# Patient Record
Sex: Female | Born: 1990 | Hispanic: No | Marital: Married | Smoking: Never smoker
Health system: Southern US, Community
[De-identification: ages and names within clinical notes are randomized; demographics above are authoritative.]

## PROBLEM LIST (undated history)

## (undated) ENCOUNTER — Inpatient Hospital Stay (HOSPITAL_COMMUNITY): Payer: Self-pay

## (undated) DIAGNOSIS — D649 Anemia, unspecified: Secondary | ICD-10-CM

## (undated) HISTORY — PX: NO PAST SURGERIES: SHX2092

---

## 2015-01-03 ENCOUNTER — Inpatient Hospital Stay (HOSPITAL_COMMUNITY)
Admission: AD | Admit: 2015-01-03 | Discharge: 2015-01-03 | Disposition: A | Discharge status: Home / Self Care | Payer: Medicaid Other | Source: Ambulatory Visit | Attending: Family Medicine | Admitting: Family Medicine

## 2015-01-03 ENCOUNTER — Encounter (HOSPITAL_COMMUNITY): Payer: Self-pay | Admitting: *Deleted

## 2015-01-03 ENCOUNTER — Inpatient Hospital Stay (HOSPITAL_COMMUNITY): Payer: Medicaid Other

## 2015-01-03 DIAGNOSIS — O9989 Other specified diseases and conditions complicating pregnancy, childbirth and the puerperium: Secondary | ICD-10-CM

## 2015-01-03 DIAGNOSIS — O26899 Other specified pregnancy related conditions, unspecified trimester: Secondary | ICD-10-CM

## 2015-01-03 DIAGNOSIS — M549 Dorsalgia, unspecified: Secondary | ICD-10-CM | POA: Diagnosis not present

## 2015-01-03 DIAGNOSIS — R102 Pelvic and perineal pain: Secondary | ICD-10-CM | POA: Diagnosis present

## 2015-01-03 DIAGNOSIS — O210 Mild hyperemesis gravidarum: Secondary | ICD-10-CM | POA: Diagnosis not present

## 2015-01-03 LAB — URINE MICROSCOPIC-ADD ON

## 2015-01-03 LAB — WET PREP, GENITAL
CLUE CELLS WET PREP: NONE SEEN
TRICH WET PREP: NONE SEEN
YEAST WET PREP: NONE SEEN

## 2015-01-03 LAB — URINALYSIS, ROUTINE W REFLEX MICROSCOPIC
BILIRUBIN URINE: NEGATIVE
GLUCOSE, UA: NEGATIVE mg/dL
Hgb urine dipstick: NEGATIVE
KETONES UR: 40 mg/dL — AB
NITRITE: NEGATIVE
PH: 6 (ref 5.0–8.0)
PROTEIN: NEGATIVE mg/dL
Specific Gravity, Urine: 1.025 (ref 1.005–1.030)
Urobilinogen, UA: 1 mg/dL (ref 0.0–1.0)

## 2015-01-03 LAB — CBC
HEMATOCRIT: 39.7 % (ref 36.0–46.0)
HEMOGLOBIN: 13.7 g/dL (ref 12.0–15.0)
MCH: 30.2 pg (ref 26.0–34.0)
MCHC: 34.5 g/dL (ref 30.0–36.0)
MCV: 87.4 fL (ref 78.0–100.0)
Platelets: 248 10*3/uL (ref 150–400)
RBC: 4.54 MIL/uL (ref 3.87–5.11)
RDW: 13.5 % (ref 11.5–15.5)
WBC: 12 10*3/uL — ABNORMAL HIGH (ref 4.0–10.5)

## 2015-01-03 LAB — POCT PREGNANCY, URINE: Preg Test, Ur: POSITIVE — AB

## 2015-01-03 LAB — HCG, QUANTITATIVE, PREGNANCY: HCG, BETA CHAIN, QUANT, S: 1117 m[IU]/mL — AB (ref <5)

## 2015-01-03 NOTE — MAU Note (Signed)
Pt presents to MAU with complaints of lower abdominal cramping, back pain and nausea for a couple of weeks. + home pregnancy test this week. Vaginal spotting on and off

## 2015-01-03 NOTE — MAU Provider Note (Signed)
History     CSN: [id number]  Arrival date and time: 01/03/15 [id number]   First Provider Initiated Contact with Patient 01/03/15 2222      Chief Complaint  Patient presents with  . Abdominal Pain  . Back Pain  . Morning Sickness   HPI Comments: LMP 11/19/14.   Pelvic Pain The patient's primary symptoms include pelvic pain. This is a new problem. The current episode started 1 to 4 weeks ago. The problem occurs intermittently. The problem has been waxing and waning. The pain is mild. The problem affects both sides. She is pregnant. The vaginal bleeding is spotting (some spotting one week ago, but none in the last two days. ). She has not been passing clots. She has not been passing tissue. Nothing aggravates the symptoms. She has tried nothing for the symptoms. She uses nothing for contraception.     Past Medical History  Diagnosis Date  . Medical history non-contributory     Past Surgical History  Procedure Laterality Date  . No past surgeries      History reviewed. No pertinent family history.  Social History  Substance Use Topics  . Smoking status: Never Smoker   . Smokeless tobacco: None  . Alcohol Use: No    Allergies: No Known Allergies  Prescriptions prior to admission  Medication Sig Dispense Refill Last Dose  . calcium carbonate (TUMS - DOSED IN MG ELEMENTAL CALCIUM) 500 MG chewable tablet Chew 1 tablet by mouth daily as needed for indigestion or heartburn.   Past Month at Unknown time  . Prenatal Vit-Fe Fumarate-FA (PRENATAL MULTIVITAMIN) TABS tablet Take 1 tablet by mouth daily at 12 noon.   01/03/2015 at Unknown time    Review of Systems  Genitourinary: Positive for pelvic pain.   Physical Exam   Blood pressure 146/82, pulse 112, temperature 98.2 F (36.8 C), resp. rate 16, height 5\' 3"  (1.6 m), weight 61.689 kg (136 lb), last menstrual period 11/18/2014.  Physical Exam  Nursing note and vitals reviewed. Constitutional: She is oriented to person, place,  and time. She appears well-developed and well-nourished. No distress.  HENT:  Head: Normocephalic.  Cardiovascular: Normal rate.   Respiratory: Effort normal.  GI: Soft. There is no tenderness. There is no rebound.  Genitourinary:   External: no lesion Vagina: small amount of white discharge Cervix: pink, smooth, no CMT Uterus: NSSC Adnexa: NT   Neurological: She is alert and oriented to person, place, and time.  Skin: Skin is warm and dry.  Psychiatric: She has a normal mood and affect.    Results for orders placed or performed during the hospital encounter of 01/03/15 (from the past 24 hour(s))  Urinalysis, Routine w reflex microscopic (not at [facility])     Status: Abnormal   Collection Time: 01/03/15  6:00 PM  Result Value Ref Range   Color, Urine YELLOW YELLOW   APPearance CLEAR CLEAR   Specific Gravity, Urine 1.025 1.005 - 1.030   pH 6.0 5.0 - 8.0   Glucose, UA NEGATIVE NEGATIVE mg/dL   Hgb urine dipstick NEGATIVE NEGATIVE   Bilirubin Urine NEGATIVE NEGATIVE   Ketones, ur 40 (A) NEGATIVE mg/dL   Protein, ur NEGATIVE NEGATIVE mg/dL   Urobilinogen, UA 1.0 0.0 - 1.0 mg/dL   Nitrite NEGATIVE NEGATIVE   Leukocytes, UA TRACE (A) NEGATIVE  Urine microscopic-add on     Status: Abnormal   Collection Time: 01/03/15  6:00 PM  Result Value Ref Range   Squamous Epithelial / LPF MANY (A) RARE  WBC, UA 3-6 <3 WBC/hpf   RBC / HPF 0-2 <3 RBC/hpf   Bacteria, UA MANY (A) RARE   Urine-Other MUCOUS PRESENT   Pregnancy, urine POC     Status: Abnormal   Collection Time: 01/03/15  6:41 PM  Result Value Ref Range   Preg Test, Ur POSITIVE (A) NEGATIVE  CBC     Status: Abnormal   Collection Time: 01/03/15  8:19 PM  Result Value Ref Range   WBC 12.0 (H) 4.0 - 10.5 K/uL   RBC 4.54 3.87 - 5.11 MIL/uL   Hemoglobin 13.7 12.0 - 15.0 g/dL   HCT 91.4 78.2 - 95.6 %   MCV 87.4 78.0 - 100.0 fL   MCH 30.2 26.0 - 34.0 pg   MCHC 34.5 30.0 - 36.0 g/dL   RDW 21.3 08.6 - 57.8 %   Platelets 248 150 -  400 K/uL  ABO/Rh     Status: None (Preliminary result)   Collection Time: 01/03/15  8:23 PM  Result Value Ref Range   ABO/RH(D) O NEG   hCG, quantitative, pregnancy     Status: Abnormal   Collection Time: 01/03/15  8:23 PM  Result Value Ref Range   hCG, Beta Chain, Quant, S 1117 (H) <5 mIU/mL   US Ob Comp Less 14 Wks  01/03/2015  CLINICAL DATA:  24 year old female with positive pregnancy test with vaginal spotting and pelvic cramping for the past 2 weeks. Last menstrual period 11/18/2014. EXAM: OBSTETRIC <14 WK [place] AND TRANSVAGINAL OB US TECHNIQUE: Both transabdominal and transvaginal ultrasound examinations were performed for complete evaluation of the gestation as well as the maternal uterus, adnexal regions, and pelvic cul-de-sac. Transvaginal technique was performed to assess early pregnancy. COMPARISON:  None. FINDINGS: Intrauterine gestational sac: None. Yolk sac:  None. Embryo:  None. Cardiac Activity: None. Heart Rate: N/A Maternal uterus/adnexae: Uterus is normal in appearance. Endometrium measures approximately 11 mm in thickness. Bilateral ovaries are normal in appearance. Small volume of free fluid, most notable in the left adnexal region. IMPRESSION: 1. No IUP identified. 2. Trace volume of free fluid adjacent to the left adnexa, presumably physiologic. Fifty Electronically Signed   By: [name] M.D.   On: 01/03/2015 21:49   US Ob Transvaginal  01/03/2015  CLINICAL DATA:  24 year old female with positive pregnancy test with vaginal spotting and pelvic cramping for the past 2 weeks. Last menstrual period 11/18/2014. EXAM: OBSTETRIC <14 WK [place] AND TRANSVAGINAL OB US TECHNIQUE: Both transabdominal and transvaginal ultrasound examinations were performed for complete evaluation of the gestation as well as the maternal uterus, adnexal regions, and pelvic cul-de-sac. Transvaginal technique was performed to assess early pregnancy. COMPARISON:  None. FINDINGS: Intrauterine gestational sac:  None. Yolk sac:  None. Embryo:  None. Cardiac Activity: None. Heart Rate: N/A Maternal uterus/adnexae: Uterus is normal in appearance. Endometrium measures approximately 11 mm in thickness. Bilateral ovaries are normal in appearance. Small volume of free fluid, most notable in the left adnexal region. IMPRESSION: 1. No IUP identified. 2. Trace volume of free fluid adjacent to the left adnexa, presumably physiologic. Fifty Electronically Signed   By: [name] M.D.   On: 01/03/2015 21:49    MAU Course  Procedures  MDM O neg, but no bleeding in the last 72 hours. She did have some pink spotting > 1 week ago.   Assessment and Plan   1. Pelvic pain affecting pregnancy    DC home Comfort measures reviewed  1stTrimester precautions  Bleeding precautions Ectopic precautions RX: none  Return to MAU as needed   Follow-up Information    Follow up with [facility].   Why:  Saturday 01/05/15 for repeat bloodwork. Come around 8-9pm    Contact information:   [address] [id number] mc [place] [address] [phone]        Tawnya Crook 01/03/2015, 10:23 PM

## 2015-01-03 NOTE — Discharge Instructions (Signed)

## 2015-01-04 LAB — HIV ANTIBODY (ROUTINE TESTING W REFLEX): HIV SCREEN 4TH GENERATION: NONREACTIVE

## 2015-01-04 LAB — RPR: RPR: NONREACTIVE

## 2015-01-04 LAB — ABO/RH: ABO/RH(D): O NEG

## 2015-01-04 LAB — GC/CHLAMYDIA PROBE AMP (~~LOC~~) NOT AT ARMC
CHLAMYDIA, DNA PROBE: NEGATIVE
Neisseria Gonorrhea: NEGATIVE

## 2015-01-05 ENCOUNTER — Encounter (HOSPITAL_COMMUNITY): Payer: Self-pay | Admitting: Family Medicine

## 2015-01-05 ENCOUNTER — Inpatient Hospital Stay (HOSPITAL_COMMUNITY)
Admission: EM | Admit: 2015-01-05 | Discharge: 2015-01-05 | Disposition: A | Discharge status: Home / Self Care | Payer: Medicaid Other | Source: Ambulatory Visit | Attending: Obstetrics & Gynecology | Admitting: Obstetrics & Gynecology

## 2015-01-05 DIAGNOSIS — Z3A01 Less than 8 weeks gestation of pregnancy: Secondary | ICD-10-CM | POA: Insufficient documentation

## 2015-01-05 DIAGNOSIS — O26891 Other specified pregnancy related conditions, first trimester: Secondary | ICD-10-CM | POA: Diagnosis not present

## 2015-01-05 DIAGNOSIS — O209 Hemorrhage in early pregnancy, unspecified: Secondary | ICD-10-CM | POA: Diagnosis not present

## 2015-01-05 LAB — HCG, QUANTITATIVE, PREGNANCY: hCG, Beta Chain, Quant, S: 1980 m[IU]/mL — ABNORMAL HIGH (ref <5)

## 2015-01-05 NOTE — MAU Provider Note (Signed)
  History    CSN: [id number] Arrival date and time: 01/05/15 [id number] First Provider Initiated Contact with Patient 01/05/15 2154      Chief Complaint  Patient presents with  . Follow-up   HPI Patient is 24 y.o. G1P0 5353w6d here for repeat bHCG. Denies abdominal pain. Reports nausea. Presented on 10/27 with pelvic pain and spotting which have resolved.   OB History    Gravida Para Term Preterm AB TAB SAB Ectopic Multiple Living   1               Past Medical History  Diagnosis Date  . Medical history non-contributory     Past Surgical History  Procedure Laterality Date  . No past surgeries      History reviewed. No pertinent family history.  Social History  Substance Use Topics  . Smoking status: Never Smoker   . Smokeless tobacco: None  . Alcohol Use: No    Allergies: No Known Allergies  Prescriptions prior to admission  Medication Sig Dispense Refill Last Dose  . calcium carbonate (TUMS - DOSED IN MG ELEMENTAL CALCIUM) 500 MG chewable tablet Chew 1 tablet by mouth daily as needed for indigestion or heartburn.   Past Month at Unknown time  . Prenatal Vit-Fe Fumarate-FA (PRENATAL MULTIVITAMIN) TABS tablet Take 1 tablet by mouth daily at 12 noon.   01/03/2015 at Unknown time    Review of Systems  Constitutional: Negative for fever and chills.  Respiratory: Negative for cough and shortness of breath.   Cardiovascular: Negative for leg swelling.  Gastrointestinal: Positive for nausea. Negative for vomiting and abdominal pain.  Genitourinary: Negative for dysuria, frequency and flank pain.  Musculoskeletal: Negative for myalgias.  Neurological: Negative for weakness.  Endo/Heme/Allergies: Does not bruise/bleed easily.  Psychiatric/Behavioral: The patient is not nervous/anxious.    Physical Exam   Blood pressure 132/86, pulse 107, temperature 98.8 F (37.1 C), resp. rate 18, height 5' 3.5" (1.613 m), weight 135 lb 9.6 oz (61.508 kg), last menstrual period  11/18/2014.  Physical Exam  Nursing note and vitals reviewed. Constitutional: She is oriented to person, place, and time. She appears well-developed and well-nourished. No distress.  HENT:  Head: Normocephalic and atraumatic.  Eyes: Conjunctivae are normal. No scleral icterus.  Neck: Normal range of motion. Neck supple.  Cardiovascular: Normal rate and intact distal pulses.   Respiratory: Effort normal. She exhibits no tenderness.  GI: Soft. There is no tenderness. There is no rebound and no guarding.  Musculoskeletal: Normal range of motion. She exhibits no edema.  Neurological: She is alert and oriented to person, place, and time.  Skin: Skin is warm and dry. No rash noted.  Psychiatric: She has a normal mood and affect.    MAU Course  Procedures  MDM  Lab Results  Component Value Date   HCGBETAQNT 1980* 01/05/2015   HCGBETAQNT 1117* 01/03/2015   TVUS 10/27 IMPRESSION: 1. No IUP identified. 2. Trace volume of free fluid adjacent to the left adnexa, presumably physiologic.  Assessment and Plan  Xzaria Alaaeldin 24 y.o. G1P0 at 6453w6d by LMP  #Early pregnancy bHCG is approximately double in 48 hours which is reassuring. Likely patient is < 6 weeks. Discussed need for repeat imaging in 10 days to confirm IUP.  Return in 10d for repeat US.  Return precautions reviewed in detail and patient voiced understandning Provided pregnancy verification letter  Federico FlakeKimberly Niles Charron Coultas 01/05/2015, 10:13 PM

## 2015-01-05 NOTE — MAU Note (Addendum)
Dr Alvester MorinNewton in Triage to discuss test results and d/c plan with pt. Pt d/c home from Triage

## 2015-01-05 NOTE — Discharge Instructions (Signed)
You need to come back in ~10 days for another ultrasound. This has been ordered already.   Vaginal Bleeding During Pregnancy, First Trimester A small amount of bleeding (spotting) from the vagina is relatively common in early pregnancy. It usually stops on its own. Various things may cause bleeding or spotting in early pregnancy. Some bleeding may be related to the pregnancy, and some may not. In most cases, the bleeding is normal and is not a problem. However, bleeding can also be a sign of something serious. Be sure to tell your health care provider about any vaginal bleeding right away. Some possible causes of vaginal bleeding during the first trimester include:  Infection or inflammation of the cervix.  Growths (polyps) on the cervix.  Miscarriage or threatened miscarriage.  Pregnancy tissue has developed outside of the uterus and in a fallopian tube (tubal pregnancy).  Tiny cysts have developed in the uterus instead of pregnancy tissue (molar pregnancy). HOME CARE INSTRUCTIONS  Watch your condition for any changes. The following actions may help to lessen any discomfort you are feeling:  Follow your health care provider's instructions for limiting your activity. If your health care provider orders bed rest, you may need to stay in bed and only get up to use the bathroom. However, your health care provider may allow you to continue light activity.  If needed, make plans for someone to help with your regular activities and responsibilities while you are on bed rest.  Keep track of the number of pads you use each day, how often you change pads, and how soaked (saturated) they are. Write this down.  Do not use tampons. Do not douche.  Do not have sexual intercourse or orgasms until approved by your health care provider.  If you pass any tissue from your vagina, save the tissue so you can show it to your health care provider.  Only take over-the-counter or prescription medicines as  directed by your health care provider.  Do not take aspirin because it can make you bleed.  Keep all follow-up appointments as directed by your health care provider. SEEK MEDICAL CARE IF:  You have any vaginal bleeding during any part of your pregnancy.  You have cramps or labor pains.  You have a fever, not controlled by medicine. SEEK IMMEDIATE MEDICAL CARE IF:   You have severe cramps in your back or belly (abdomen).  You pass large clots or tissue from your vagina.  Your bleeding increases.  You feel light-headed or weak, or you have fainting episodes.  You have chills.  You are leaking fluid or have a gush of fluid from your vagina.  You pass out while having a bowel movement. MAKE SURE YOU:  Understand these instructions.  Will watch your condition.  Will get help right away if you are not doing well or get worse.   This information is not intended to replace advice given to you by your health care provider. Make sure you discuss any questions you have with your health care provider.   Document Released: 12/03/2004 Document Revised: 02/28/2013 Document Reviewed: 10/31/2012 Elsevier Interactive Patient Education [employer]2016 Elsevier Inc.

## 2015-01-05 NOTE — MAU Note (Signed)
Here for repeat lab work. Denies any problems

## 2015-01-13 ENCOUNTER — Encounter (HOSPITAL_COMMUNITY): Payer: Self-pay | Admitting: *Deleted

## 2015-01-13 ENCOUNTER — Inpatient Hospital Stay (HOSPITAL_COMMUNITY): Payer: Medicaid Other

## 2015-01-13 ENCOUNTER — Inpatient Hospital Stay (HOSPITAL_COMMUNITY)
Admission: AD | Admit: 2015-01-13 | Discharge: 2015-01-13 | Disposition: A | Discharge status: Home / Self Care | Payer: Medicaid Other | Source: Ambulatory Visit | Attending: Obstetrics & Gynecology | Admitting: Obstetrics & Gynecology

## 2015-01-13 DIAGNOSIS — N949 Unspecified condition associated with female genital organs and menstrual cycle: Secondary | ICD-10-CM | POA: Diagnosis not present

## 2015-01-13 DIAGNOSIS — Z3A08 8 weeks gestation of pregnancy: Secondary | ICD-10-CM | POA: Diagnosis not present

## 2015-01-13 DIAGNOSIS — O26891 Other specified pregnancy related conditions, first trimester: Secondary | ICD-10-CM

## 2015-01-13 DIAGNOSIS — Z6791 Unspecified blood type, Rh negative: Secondary | ICD-10-CM | POA: Diagnosis not present

## 2015-01-13 DIAGNOSIS — O26851 Spotting complicating pregnancy, first trimester: Secondary | ICD-10-CM

## 2015-01-13 DIAGNOSIS — O3680X0 Pregnancy with inconclusive fetal viability, not applicable or unspecified: Secondary | ICD-10-CM

## 2015-01-13 DIAGNOSIS — O209 Hemorrhage in early pregnancy, unspecified: Secondary | ICD-10-CM | POA: Diagnosis present

## 2015-01-13 DIAGNOSIS — O360110 Maternal care for anti-D [Rh] antibodies, first trimester, not applicable or unspecified: Secondary | ICD-10-CM

## 2015-01-13 DIAGNOSIS — O0281 Inappropriate change in quantitative human chorionic gonadotropin (hCG) in early pregnancy: Secondary | ICD-10-CM

## 2015-01-13 DIAGNOSIS — R8271 Bacteriuria: Secondary | ICD-10-CM | POA: Diagnosis present

## 2015-01-13 LAB — URINALYSIS, ROUTINE W REFLEX MICROSCOPIC
Bilirubin Urine: NEGATIVE
Glucose, UA: NEGATIVE mg/dL
Ketones, ur: NEGATIVE mg/dL
Nitrite: NEGATIVE
Protein, ur: NEGATIVE mg/dL
Specific Gravity, Urine: 1.01 (ref 1.005–1.030)
Urobilinogen, UA: 0.2 mg/dL (ref 0.0–1.0)
pH: 6.5 (ref 5.0–8.0)

## 2015-01-13 LAB — HCG, QUANTITATIVE, PREGNANCY: hCG, Beta Chain, Quant, S: 6104 m[IU]/mL — ABNORMAL HIGH (ref <5)

## 2015-01-13 LAB — CBC
HEMATOCRIT: 35.8 % — AB (ref 36.0–46.0)
HEMOGLOBIN: 12.3 g/dL (ref 12.0–15.0)
MCH: 30.1 pg (ref 26.0–34.0)
MCHC: 34.4 g/dL (ref 30.0–36.0)
MCV: 87.5 fL (ref 78.0–100.0)
PLATELETS: 209 10*3/uL (ref 150–400)
RBC: 4.09 MIL/uL (ref 3.87–5.11)
RDW: 13.3 % (ref 11.5–15.5)
WBC: 8.5 10*3/uL (ref 4.0–10.5)

## 2015-01-13 LAB — URINE MICROSCOPIC-ADD ON

## 2015-01-13 MED ORDER — RHO D IMMUNE GLOBULIN 1500 UNIT/2ML IJ SOSY
300.0000 ug | PREFILLED_SYRINGE | Freq: Once | INTRAMUSCULAR | Status: AC
  Administered 2015-01-13: 300 ug via INTRAMUSCULAR
  Filled 2015-01-13: qty 2

## 2015-01-13 NOTE — Discharge Instructions (Signed)
Your hCG (pregnancy hormone) level  did not rise appropriately at today's visit.  Your ultrasound now shows a possible early gestational sac in the uterus, but ectopic pregnancy is also still possible. It is very important for you to return to maternity admissions in 2 days to check the hormone level again or immediately if you experience abdominal pain or worsening bleeding as these could be a signs of a ruptured fallopian tube.  Human Chorionic Gonadotropin Test Human chorionic gonadotropin (hCG) is a hormone produced during pregnancy by the cells that form the placenta. The placenta is the organ that grows inside your womb (uterus) to nourish a developing baby. When you are pregnant, hCG starts to appear in your blood about 11 days after conception. It continues to go up for the first 8-11 weeks of pregnancy.  Your hCG level can be measured with several different types of tests. You may have:  A urine test.  hCG is eliminated from your body by your kidneys, so a urine test is one way to check for this hormone.  A urine test only shows whether there is hCG in your urine. It does not measure how much.  You may have a urine test to find out whether you are pregnant.  A home pregnancy test detects whether there is hCG in your urine.  A qualitative blood test.  Like the urine test, this blood test only shows whether there is hCG in your blood. It does not measure how much.  You may have this type of blood test to find out whether you are pregnant.  A quantitative blood test.  This type of blood test measures the amount of hCG in your blood.  You may have this type of test to diagnose an abnormal pregnancy or determine whether you are at risk of, or have had, a failed pregnancy (miscarriage). PREPARATION FOR TEST For the urine test:  Limit your fluid intake before the urine test as directed by your health care provider.  Collect the sample the first time you urinate in the  morning.  Let your health care provider know if you have blood in your urine. This may interfere with the test result. Some medicines may interfere with the urine and blood tests. Let your health care provider know about all the medicines you are taking. No additional preparation is required for the blood test.  RESULTS It is your responsibility to obtain your test results. Ask the lab or department performing the test when and how you will get your results. Talk to your health care provider if you have any questions about your test results. The results of the hCG urine test and the qualitative hCG blood test are either positive or negative. The results of the quantitative hCG blood test are reported as a number. hCG is measured in international units per liter (IU/L). Meaning of Negative Test Results A negative result on a urine or qualitative blood test could mean that you are not pregnant. It could also mean the test was done too early to detect hCG. If you still have other signs of pregnancy, the test should be repeated. Meaning of Positive Test Results A positive result on the urine or qualitative blood tests means you are most likely pregnant. Your health care provider may confirm your pregnancy with an imaging study of the inside of your uterus at 5-6 weeks (ultrasound).  Range of Normal Values Ranges for normal values for the quantitative hCG blood test may vary among different  labs and hospitals. You should always check with your health care provider after having lab work or other tests done to discuss whether your values are considered within normal limits.   Less than 5 IU/L means it is most likely you are not pregnant.  Greater than 25 IU/L means it is most likely you are pregnant. Meaning of Results Outside Normal Value Ranges If your hCG level on the quantitative test is not what would be expected, you may have the test again. It may also be important for your health care provider to  know whether your hCG level goes up or down over time. Common causes of results outside the normal range include:   Being pregnant with twins (hCG level is higher than expected).  Having an ectopic pregnancy (hCG rises more slowly than expected).  Miscarriage (hCG level falls).  Abnormal growths in the womb (hCG level is higher than expected).   This information is not intended to replace advice given to you by your health care provider. Make sure you discuss any questions you have with your health care provider.   Document Released: 03/27/2004 Document Revised: 03/16/2014 Document Reviewed: 05/30/2013 Elsevier Interactive Patient Education 2016 Elsevier Inc.  Ectopic Pregnancy An ectopic pregnancy is when the fertilized egg attaches (implants) outside the uterus. Most ectopic pregnancies occur in the fallopian tube. Rarely do ectopic pregnancies occur on the ovary, intestine, pelvis, or cervix. In an ectopic pregnancy, the fertilized egg does not have the ability to develop into a normal, healthy baby.  A ruptured ectopic pregnancy is one in which the fallopian tube gets torn or bursts and results in internal bleeding. Often there is intense abdominal pain, and sometimes, vaginal bleeding. Having an ectopic pregnancy can be life threatening. If left untreated, this dangerous condition can lead to a blood transfusion, abdominal surgery, or even death. CAUSES  Damage to the fallopian tubes is the suspected cause in most ectopic pregnancies.  RISK FACTORS Depending on your circumstances, the risk of having an ectopic pregnancy will vary. The level of risk can be divided into three categories. High Risk  You have gone through infertility treatment.  You have had a previous ectopic pregnancy.  You have had previous tubal surgery.  You have had previous surgery to have the fallopian tubes tied (tubal ligation).  You have tubal problems or diseases.  You have been exposed to DES. DES is  a medicine that was used until 1971 and had effects on babies whose mothers took the medicine.  You become pregnant while using an intrauterine device (IUD) for birth control. Moderate Risk  You have a history of infertility.  You have a history of a sexually transmitted infection (STI).  You have a history of pelvic inflammatory disease (PID).  You have scarring from endometriosis.  You have multiple sexual partners.  You smoke. Low Risk  You have had previous pelvic surgery.  You use vaginal douching.  You became sexually active before 24 years of age. SIGNS AND SYMPTOMS  An ectopic pregnancy should be suspected in anyone who has missed a period and has abdominal pain or bleeding.  You may experience normal pregnancy symptoms, such as:  Nausea.  Tiredness.  Breast tenderness.  Other symptoms may include:  Pain with intercourse.  Irregular vaginal bleeding or spotting.  Cramping or pain on one side or in the lower abdomen.  Fast heartbeat.  Passing out while having a bowel movement.  Symptoms of a ruptured ectopic pregnancy and internal bleeding may  include:  Sudden, severe pain in the abdomen and pelvis.  Dizziness or fainting.  Pain in the shoulder area. DIAGNOSIS  Tests that may be performed include:  A pregnancy test.  An ultrasound test.  Testing the specific level of pregnancy hormone in the bloodstream.  Taking a sample of uterus tissue (dilation and curettage, D&C).  Surgery to perform a visual exam of the inside of the abdomen using a thin, lighted tube with a tiny camera on the end (laparoscope). TREATMENT  An injection of a medicine called methotrexate may be given. This medicine causes the pregnancy tissue to be absorbed. It is given if:  The diagnosis is made early.  The fallopian tube has not ruptured.  You are considered to be a good candidate for the medicine. Usually, pregnancy hormone blood levels are checked after  methotrexate treatment. This is to be sure the medicine is effective. It may take 4-6 weeks for the pregnancy to be absorbed (though most pregnancies will be absorbed by 3 weeks). Surgical treatment may be needed. A laparoscope may be used to remove the pregnancy tissue. If severe internal bleeding occurs, a cut (incision) may be made in the lower abdomen (laparotomy), and the ectopic pregnancy is removed. This stops the bleeding. Part of the fallopian tube, or the whole tube, may be removed as well (salpingectomy). After surgery, pregnancy hormone tests may be done to be sure there is no pregnancy tissue left. You may receive a Rho (D) immune globulin shot if you are Rh negative and the father is Rh positive, or if you do not know the Rh type of the father. This is to prevent problems with any future pregnancy. SEEK IMMEDIATE MEDICAL CARE IF:  You have any symptoms of an ectopic pregnancy. This is a medical emergency. MAKE SURE YOU:  Understand these instructions.  Will watch your condition.  Will get help right away if you are not doing well or get worse.   This information is not intended to replace advice given to you by your health care provider. Make sure you discuss any questions you have with your health care provider.   Document Released: 04/02/2004 Document Revised: 03/16/2014 Document Reviewed: 09/22/2012 Elsevier Interactive Patient Education [employer]

## 2015-01-13 NOTE — MAU Provider Note (Signed)
History   Chief Complaint:  Vaginal Bleeding   Nomie Alaaeldin is  24 y.o. G1P0 Patient's last menstrual period was 11/18/2014.[name] Patient is here for follow up of quantitative HCG and ongoing surveillance of pregnancy status.   She is [redacted]w[redacted]d weeks gestation  by LMP.    Since her last visit, the patient is with new complaint.     ROS Abdomin Pain: Mild pressure Vaginal bleeding: spotting. No passage of tissue   Passage of clots or tissue: None Dizziness: None GU: Denies dysuria, urgency, frequency, hematuria.  Her previous Quantitative HCG values are:  Results for CARRIANN, HESSE (MRN [id number]) as of 01/13/2015 14:54  Ref. Range 01/03/2015 20:23 01/05/2015 20:05  HCG, Beta Chain, Quant, S Latest Ref Range: <5 mIU/mL 1117 (H) 1980 (H)    Physical Exam   BP 128/64 mmHg  Pulse 85  Temp(Src) 97.4 F (36.3 C) (Oral)  Resp 20  Ht 5' 4.25" (1.632 m)  Wt 62.199 kg (137 lb 2 oz)  BMI 23.35 kg/m2  LMP 11/18/2014 Constitutional: Well-nourished female in no apparent distress. No pallor Neuro: Alert and oriented 4 Cardiovascular: Normal rate Respiratory: Normal effort and rate Abdomen: Soft, nontender Gynecological Exam: Deferred due to recent exam.  Labs: Results for orders placed or performed during the hospital encounter of 01/13/15 (from the past 24 hour(s))  Urinalysis, Routine w reflex microscopic (not at [facility])   Collection Time: 01/13/15  2:54 PM  Result Value Ref Range   Color, Urine YELLOW YELLOW   APPearance TURBID (A) CLEAR   Specific Gravity, Urine 1.010 1.005 - 1.030   pH 6.5 5.0 - 8.0   Glucose, UA NEGATIVE NEGATIVE mg/dL   Hgb urine dipstick LARGE (A) NEGATIVE   Bilirubin Urine NEGATIVE NEGATIVE   Ketones, ur NEGATIVE NEGATIVE mg/dL   Protein, ur NEGATIVE NEGATIVE mg/dL   Urobilinogen, UA 0.2 0.0 - 1.0 mg/dL   Nitrite NEGATIVE NEGATIVE   Leukocytes, UA TRACE (A) NEGATIVE  Urine microscopic-add on   Collection Time: 01/13/15  2:54 PM  Result Value Ref  Range   Squamous Epithelial / LPF FEW (A) RARE   WBC, UA 7-10 <3 WBC/hpf   RBC / HPF 3-6 <3 RBC/hpf   Bacteria, UA FEW (A) RARE  CBC   Collection Time: 01/13/15  3:15 PM  Result Value Ref Range   WBC 8.5 4.0 - 10.5 K/uL   RBC 4.09 3.87 - 5.11 MIL/uL   Hemoglobin 12.3 12.0 - 15.0 g/dL   HCT 11.9 (L) 14.7 - 82.9 %   MCV 87.5 78.0 - 100.0 fL   MCH 30.1 26.0 - 34.0 pg   MCHC 34.4 30.0 - 36.0 g/dL   RDW 56.2 13.0 - 86.5 %   Platelets 209 150 - 400 K/uL  hCG, quantitative, pregnancy   Collection Time: 01/13/15  3:15 PM  Result Value Ref Range   hCG, Beta Chain, Quant, S 6104 (H) <5 mIU/mL  Rh IG workup (includes ABO/Rh)   Collection Time: 01/13/15  3:15 PM  Result Value Ref Range   Gestational Age(Wks) 8    ABO/RH(D) O NEG    Antibody Screen NEG    Unit Number [id number]    Blood Component Type RHIG    Unit division 00    Status of Unit ISSUED    Transfusion Status OK TO TRANSFUSE     Ultrasound Studies:   US Ob Comp Less 14 Wks  01/03/2015  CLINICAL DATA:  24 year old female with positive pregnancy test with vaginal spotting and pelvic  cramping for the past 2 weeks. Last menstrual period 11/18/2014. EXAM: OBSTETRIC <14 WK US AND TRANSVAGINAL OB US TECHNIQUE: Both transabdominal and transvaginal ultrasound examinations were performed for complete evaluation of the gestation as well as the maternal uterus, adnexal regions, and pelvic cul-de-sac. Transvaginal technique was performed to assess early pregnancy. COMPARISON:  None. FINDINGS: Intrauterine gestational sac: None. Yolk sac:  None. Embryo:  None. Cardiac Activity: None. Heart Rate: N/A Maternal uterus/adnexae: Uterus is normal in appearance. Endometrium measures approximately 11 mm in thickness. Bilateral ovaries are normal in appearance. Small volume of free fluid, most notable in the left adnexal region. IMPRESSION: 1. No IUP identified. 2. Trace volume of free fluid adjacent to the left adnexa, presumably physiologic. Fifty  Electronically Signed   By: Trudie Reedaniel  Entrikin M.D.   On: 01/03/2015 21:49   [place] Ob Transvaginal  01/13/2015  CLINICAL DATA:  24 year old pregnant female with vaginal spotting. EDC by LMP: 08/25/2015, projecting to an expected gestational age of [redacted] weeks 0 days. EXAM: TRANSVAGINAL OB ULTRASOUND TECHNIQUE: Transvaginal ultrasound was performed for complete evaluation of the gestation as well as the maternal uterus, adnexal regions, and pelvic cul-de-sac. COMPARISON:  01/03/2015 obstetric scan. FINDINGS: Intrauterine gestational sac: There is a single eccentric intrauterine sac-like structure with double decidual sac sign, which appears normal in shape. No perigestational bleed. Yolk sac:  Not visualized. Embryo:  Not visualized. Cardiac Activity: Not visualized. MSD: 6.9  mm   5 w   3  d              US EDC: 09/12/2015 Maternal uterus/adnexae: No uterine fibroids. Maternal left ovary measures 2.9 x 2.3 x 1.7 cm. Maternal right ovary measures 3.2 x 1.8 x 2.8 cm and appears to contain a corpus luteum. No suspicious ovarian or adnexal masses. No abnormal free fluid in the pelvis. IMPRESSION: 1. Single intrauterine sac-like structure measuring 5 weeks 3 days by mean sac diameter, with no definitive features of pregnancy such as a yolk sac or embryo, which could be due to the early gestational age. Continued close clinical follow-up with serial serum beta HCG monitoring and follow-up obstetric scan in 2-3 weeks is advised. 2. No suspicious maternal ovarian or adnexal findings. Electronically Signed   By: [name] M.D.   On: 01/13/2015 15:49   [place] Ob Transvaginal  01/03/2015  CLINICAL DATA:  24 year old female with positive pregnancy test with vaginal spotting and pelvic cramping for the past 2 weeks. Last menstrual period 11/18/2014. EXAM: OBSTETRIC <14 WK US AND TRANSVAGINAL OB US TECHNIQUE: Both transabdominal and transvaginal ultrasound examinations were performed for complete evaluation of the gestation as well  as the maternal uterus, adnexal regions, and pelvic cul-de-sac. Transvaginal technique was performed to assess early pregnancy. COMPARISON:  None. FINDINGS: Intrauterine gestational sac: None. Yolk sac:  None. Embryo:  None. Cardiac Activity: None. Heart Rate: N/A Maternal uterus/adnexae: Uterus is normal in appearance. Endometrium measures approximately 11 mm in thickness. Bilateral ovaries are normal in appearance. Small volume of free fluid, most notable in the left adnexal region. IMPRESSION: 1. No IUP identified. 2. Trace volume of free fluid adjacent to the left adnexa, presumably physiologic. Fifty Electronically Signed   By: Trudie Reedaniel  Entrikin M.D.   On: 01/03/2015 21:49    MAU course/MDM: Quantitative hCG, ultrasound ordered  Spotting in early pregnancy with abnormal rise in Quant, but normal progression of ultrasound. IUP not confirmed. Hemodynamically stable. Discussed concern for either ectopic pregnancy or SAB due to abnormal rise in DaltonQuant. Discussed risk  of undiagnosed ectopic pregnancy and possibility of rupture. Offered methotrexate versus expectant management with follow-up Quant in 2 days. Patient was able to repeat back the risks of untreated ectopic pregnancy, but declines methotrexate today and prefers follow-up in 2 days for Quant.  Rhophylac given for Rh-.  Assessment: [redacted]w[redacted]d weeks gestation by LMP  w/  inappropriate rise in Quant 1. Pregnancy of unknown anatomic location   2. Pelvic pressure in pregnancy, antepartum, first trimester   3. Spotting affecting pregnancy in first trimester   4. Inappropriate change in quantitative hCG in early pregnancy   5. Rh negative, antepartum, first trimester, not applicable or unspecified fetus    Plan: Discharge home in stable condition. Ectopic in SAB precautions Follow-up in maternity admissions in 48 hours for repeat Quant or sooner as needed if symptoms worsen.     Medication List    TAKE these medications        prenatal  multivitamin Tabs tablet  Take 1 tablet by mouth daily at 12 noon.       Dorathy Kinsman, CNM 01/13/2015, 2:58 PM  2/3

## 2015-01-13 NOTE — MAU Note (Signed)
Patient presents at [redacted] weeks gestation with c/o spotting earlier today. Denies pain or discharge but does have pressure.

## 2015-01-14 LAB — RH IG WORKUP (INCLUDES ABO/RH)
ABO/RH(D): O NEG
Antibody Screen: NEGATIVE
GESTATIONAL AGE(WKS): 8
Unit division: 0

## 2015-01-15 ENCOUNTER — Inpatient Hospital Stay (HOSPITAL_COMMUNITY)
Admission: AD | Admit: 2015-01-15 | Discharge: 2015-01-15 | Disposition: A | Discharge status: Home / Self Care | Payer: Medicaid Other | Source: Ambulatory Visit | Attending: Obstetrics and Gynecology | Admitting: Obstetrics and Gynecology

## 2015-01-15 ENCOUNTER — Ambulatory Visit (HOSPITAL_COMMUNITY): Payer: Self-pay

## 2015-01-15 ENCOUNTER — Encounter (HOSPITAL_COMMUNITY): Payer: Self-pay | Admitting: Advanced Practice Midwife

## 2015-01-15 DIAGNOSIS — Z3A08 8 weeks gestation of pregnancy: Secondary | ICD-10-CM | POA: Insufficient documentation

## 2015-01-15 DIAGNOSIS — O009 Unspecified ectopic pregnancy without intrauterine pregnancy: Secondary | ICD-10-CM | POA: Insufficient documentation

## 2015-01-15 DIAGNOSIS — R8271 Bacteriuria: Secondary | ICD-10-CM | POA: Diagnosis present

## 2015-01-15 LAB — CULTURE, OB URINE: SPECIAL REQUESTS: NORMAL

## 2015-01-15 LAB — CBC
HCT: 36.4 % (ref 36.0–46.0)
Hemoglobin: 12.4 g/dL (ref 12.0–15.0)
MCH: 30.1 pg (ref 26.0–34.0)
MCHC: 34.1 g/dL (ref 30.0–36.0)
MCV: 88.3 fL (ref 78.0–100.0)
PLATELETS: 224 10*3/uL (ref 150–400)
RBC: 4.12 MIL/uL (ref 3.87–5.11)
RDW: 13.4 % (ref 11.5–15.5)
WBC: 9.6 10*3/uL (ref 4.0–10.5)

## 2015-01-15 LAB — COMPREHENSIVE METABOLIC PANEL
ALT: 12 U/L — AB (ref 14–54)
ANION GAP: 7 (ref 5–15)
AST: 16 U/L (ref 15–41)
Albumin: 4.6 g/dL (ref 3.5–5.0)
Alkaline Phosphatase: 50 U/L (ref 38–126)
BUN: 12 mg/dL (ref 6–20)
CHLORIDE: 104 mmol/L (ref 101–111)
CO2: 26 mmol/L (ref 22–32)
CREATININE: 0.62 mg/dL (ref 0.44–1.00)
Calcium: 9.7 mg/dL (ref 8.9–10.3)
Glucose, Bld: 123 mg/dL — ABNORMAL HIGH (ref 65–99)
Potassium: 3.6 mmol/L (ref 3.5–5.1)
SODIUM: 137 mmol/L (ref 135–145)
Total Bilirubin: 0.9 mg/dL (ref 0.3–1.2)
Total Protein: 7.6 g/dL (ref 6.5–8.1)

## 2015-01-15 LAB — HCG, QUANTITATIVE, PREGNANCY: hCG, Beta Chain, Quant, S: 8453 m[IU]/mL — ABNORMAL HIGH (ref <5)

## 2015-01-15 MED ORDER — METHOTREXATE INJECTION FOR WOMEN'S HOSPITAL
50.0000 mg/m2 | Freq: Once | INTRAMUSCULAR | Status: AC
  Administered 2015-01-15: 85 mg via INTRAMUSCULAR
  Filled 2015-01-15: qty 1.7

## 2015-01-15 MED ORDER — OXYCODONE-ACETAMINOPHEN 5-325 MG PO TABS: 1.0000 | ORAL_TABLET | ORAL | Status: DC | PRN

## 2015-01-15 NOTE — Discharge Instructions (Signed)
Ectopic Pregnancy °An ectopic pregnancy is when the fertilized egg attaches (implants) outside the uterus. Most ectopic pregnancies occur in the fallopian tube. Rarely do ectopic pregnancies occur on the ovary, intestine, pelvis, or cervix. In an ectopic pregnancy, the fertilized egg does not have the ability to develop into a normal, healthy baby.  °A ruptured ectopic pregnancy is one in which the fallopian tube gets torn or bursts and results in internal bleeding. Often there is intense abdominal pain, and sometimes, vaginal bleeding. Having an ectopic pregnancy can be life threatening. If left untreated, this dangerous condition can lead to a blood transfusion, abdominal surgery, or even death. °CAUSES  °Damage to the fallopian tubes is the suspected cause in most ectopic pregnancies.  °RISK FACTORS °Depending on your circumstances, the risk of having an ectopic pregnancy will vary. The level of risk can be divided into three categories. °High Risk °· You have gone through infertility treatment. °· You have had a previous ectopic pregnancy. °· You have had previous tubal surgery. °· You have had previous surgery to have the fallopian tubes tied (tubal ligation). °· You have tubal problems or diseases. °· You have been exposed to DES. DES is a medicine that was used until 1971 and had effects on babies whose mothers took the medicine. °· You become pregnant while using an intrauterine device (IUD) for birth control.  °Moderate Risk °· You have a history of infertility. °· You have a history of a sexually transmitted infection (STI). °· You have a history of pelvic inflammatory disease (PID). °· You have scarring from endometriosis. °· You have multiple sexual partners. °· You smoke.  °Low Risk °· You have had previous pelvic surgery. °· You use vaginal douching. °· You became sexually active before 24 years of age. °SIGNS AND SYMPTOMS  °An ectopic pregnancy should be suspected in anyone who has missed a period and  has abdominal pain or bleeding. °· You may experience normal pregnancy symptoms, such as: °· Nausea. °· Tiredness. °· Breast tenderness. °· Other symptoms may include: °· Pain with intercourse. °· Irregular vaginal bleeding or spotting. °· Cramping or pain on one side or in the lower abdomen. °· Fast heartbeat. °· Passing out while having a bowel movement. °· Symptoms of a ruptured ectopic pregnancy and internal bleeding may include: °· Sudden, severe pain in the abdomen and pelvis. °· Dizziness or fainting. °· Pain in the shoulder area. °DIAGNOSIS  °Tests that may be performed include: °· A pregnancy test. °· An ultrasound test. °· Testing the specific level of pregnancy hormone in the bloodstream. °· Taking a sample of uterus tissue (dilation and curettage, D&C). °· Surgery to perform a visual exam of the inside of the abdomen using a thin, lighted tube with a tiny camera on the end (laparoscope). °TREATMENT  °An injection of a medicine called methotrexate may be given. This medicine causes the pregnancy tissue to be absorbed. It is given if: °· The diagnosis is made early. °· The fallopian tube has not ruptured. °· You are considered to be a good candidate for the medicine. °Usually, pregnancy hormone blood levels are checked after methotrexate treatment. This is to be sure the medicine is effective. It may take 4-6 weeks for the pregnancy to be absorbed (though most pregnancies will be absorbed by 3 weeks). °Surgical treatment may be needed. A laparoscope may be used to remove the pregnancy tissue. If severe internal bleeding occurs, a cut (incision) may be made in the lower abdomen (laparotomy), and the ectopic   pregnancy is removed. This stops the bleeding. Part of the fallopian tube, or the whole tube, may be removed as well (salpingectomy). After surgery, pregnancy hormone tests may be done to be sure there is no pregnancy tissue left. You may receive a Rho (D) immune globulin shot if you are Rh negative and  the father is Rh positive, or if you do not know the Rh type of the father. This is to prevent problems with any future pregnancy. °SEEK IMMEDIATE MEDICAL CARE IF:  °You have any symptoms of an ectopic pregnancy. This is a medical emergency. °MAKE SURE YOU: °· Understand these instructions. °· Will watch your condition. °· Will get help right away if you are not doing well or get worse. °  °This information is not intended to replace advice given to you by your health care provider. Make sure you discuss any questions you have with your health care provider. °  °Document Released: 04/02/2004 Document Revised: 03/16/2014 Document Reviewed: 09/22/2012 °Elsevier Interactive Patient Education ©2016 [employer] °Methotrexate Treatment for an Ectopic Pregnancy °Methotrexate is a medicine that treats ectopic pregnancy by stopping the growth of the fertilized egg. It also helps your body absorb tissue from the egg. This takes between 2 weeks and 6 weeks. Most ectopic pregnancies can be successfully treated with methotrexate if they are detected early enough. °LET YOUR HEALTH CARE PROVIDER KNOW ABOUT: °· Any allergies you have. °· All medicines you are taking, including vitamins, herbs, eye drops, creams, and over-the-counter medicines. °· Medical conditions you have. °RISKS AND COMPLICATIONS °Generally, this is a safe treatment. However, as with any treatment, problems can occur. Possible problems or side effects include: °· Nausea. °· Vomiting. °· Diarrhea. °· Abdominal cramping. °· Mouth sores. °· Increased vaginal bleeding or spotting.   °· Swelling or irritation of the lining of your lungs (pneumonitis).  °· Failed treatment and continuation of the pregnancy.   °· Liver damage. °· Hair loss. °There is still a risk of the ectopic pregnancy rupturing while using the methotrexate. °BEFORE THE PROCEDURE °Before you take the medicine:  °· Liver tests, kidney tests, and a complete blood test are performed. °· Blood tests  are performed to measure the pregnancy hormone levels and to determine your blood type. °· If you are Rh-negative and the father is Rh-positive or his Rh type is not known, you will be given a Rho (D) immune globulin shot. °PROCEDURE  °There are two methods that your health care provider may use to prescribe methotrexate. One method involves a single dose or injection of the medicine. Another method involves a series of doses given through several injections.  °AFTER THE PROCEDURE °· You may have some abdominal cramping, vaginal bleeding, and fatigue in the first few days after taking methotrexate. °· Blood tests will be taken for several weeks to check the pregnancy hormone levels. The blood tests are performed until there is no more pregnancy hormone detected in the blood. °  °This information is not intended to replace advice given to you by your health care provider. Make sure you discuss any questions you have with your health care provider. °  °Document Released: 02/17/2001 Document Revised: 03/16/2014 Document Reviewed: 12/12/2012 °Elsevier Interactive Patient Education ©2016 [employer] Inc[employer] ° °

## 2015-01-15 NOTE — MAU Note (Signed)
Pt here for F/U BHCG.  Pt denies pain or bleeding.

## 2015-01-15 NOTE — MAU Provider Note (Signed)
History   Chief Complaint:  Follow-up   Caitlyn [name] is  24 y.o. G1P0 Patient's last menstrual period was 11/18/2014.[name] Patient is here for follow up of quantitative HCG and ongoing surveillance of pregnancy status.   She is [redacted]w[redacted]d weeks gestation  by LMP.    Since her last visit, the patient is without new complaint.     ROS Abdomin Pain: None Vaginal bleeding: none now.   Passage of clots or tissue: None Dizziness: None  Her previous Quantitative HCG values are:   Results for [name], [name] (MRN [id number]) as of 01/15/2015 20:36  Ref. Range 01/03/2015 20:23 01/05/2015 20:05 01/13/2015 15:15  HCG, Beta Chain, Quant, S Latest Ref Range: <5 mIU/mL 1117 (H) 1980 (H) 6104 (H)   Physical Exam   BP 124/68 mmHg  Pulse 101  Temp(Src) 99.1 F (37.3 C) (Oral)  Resp 18  LMP 11/18/2014 Constitutional: Well-nourished female in no apparent distress. No pallor Neuro: Alert and oriented 4 Cardiovascular: Slight tachycardia Respiratory: Normal effort and rate Abdomen: Soft, nontender Gynecological Exam: examination not indicated  Labs: Results for orders placed or performed during the hospital encounter of 01/15/15 (from the past 24 hour(s))  hCG, quantitative, pregnancy   Collection Time: 01/15/15  6:33 PM  Result Value Ref Range   hCG, Beta Chain, Quant, S 8453 (H) <5 mIU/mL    Ultrasound Studies:   US Ob Comp Less 14 Wks  01/03/2015  CLINICAL DATA:  24 year old female with positive pregnancy test with vaginal spotting and pelvic cramping for the past 2 weeks. Last menstrual period 11/18/2014. EXAM: OBSTETRIC <14 WK [place] AND TRANSVAGINAL OB US TECHNIQUE: Both transabdominal and transvaginal ultrasound examinations were performed for complete evaluation of the gestation as well as the maternal uterus, adnexal regions, and pelvic cul-de-sac. Transvaginal technique was performed to assess early pregnancy. COMPARISON:  None. FINDINGS: Intrauterine gestational sac: None. Yolk sac:   None. Embryo:  None. Cardiac Activity: None. Heart Rate: N/A Maternal uterus/adnexae: Uterus is normal in appearance. Endometrium measures approximately 11 mm in thickness. Bilateral ovaries are normal in appearance. Small volume of free fluid, most notable in the left adnexal region. IMPRESSION: 1. No IUP identified. 2. Trace volume of free fluid adjacent to the left adnexa, presumably physiologic. Fifty Electronically Signed   By: [name] M.D.   On: 01/03/2015 21:49   US Ob Transvaginal  01/13/2015  CLINICAL DATA:  24 year old pregnant female with vaginal spotting. EDC by LMP: 08/25/2015, projecting to an expected gestational age of [redacted] weeks 0 days. EXAM: TRANSVAGINAL OB ULTRASOUND TECHNIQUE: Transvaginal ultrasound was performed for complete evaluation of the gestation as well as the maternal uterus, adnexal regions, and pelvic cul-de-sac. COMPARISON:  01/03/2015 obstetric scan. FINDINGS: Intrauterine gestational sac: There is a single eccentric intrauterine sac-like structure with double decidual sac sign, which appears normal in shape. No perigestational bleed. Yolk sac:  Not visualized. Embryo:  Not visualized. Cardiac Activity: Not visualized. MSD: 6.9  mm   5 w   3  d              [place] EDC: 09/12/2015 Maternal uterus/adnexae: No uterine fibroids. Maternal left ovary measures 2.9 x 2.3 x 1.7 cm. Maternal right ovary measures 3.2 x 1.8 x 2.8 cm and appears to contain a corpus luteum. No suspicious ovarian or adnexal masses. No abnormal free fluid in the pelvis. IMPRESSION: 1. Single intrauterine sac-like structure measuring 5 weeks 3 days by mean sac diameter, with no definitive features of pregnancy such as a yolk sac  or embryo, which could be due to the early gestational age. Continued close clinical follow-up with serial serum beta HCG monitoring and follow-up obstetric scan in 2-3 weeks is advised. 2. No suspicious maternal ovarian or adnexal findings. Electronically Signed   By: [name] M.D.    On: 01/13/2015 15:49   [place] Ob Transvaginal  01/03/2015  CLINICAL DATA:  24 year old female with positive pregnancy test with vaginal spotting and pelvic cramping for the past 2 weeks. Last menstrual period 11/18/2014. EXAM: OBSTETRIC <14 WK US AND TRANSVAGINAL OB US TECHNIQUE: Both transabdominal and transvaginal ultrasound examinations were performed for complete evaluation of the gestation as well as the maternal uterus, adnexal regions, and pelvic cul-de-sac. Transvaginal technique was performed to assess early pregnancy. COMPARISON:  None. FINDINGS: Intrauterine gestational sac: None. Yolk sac:  None. Embryo:  None. Cardiac Activity: None. Heart Rate: N/A Maternal uterus/adnexae: Uterus is normal in appearance. Endometrium measures approximately 11 mm in thickness. Bilateral ovaries are normal in appearance. Small volume of free fluid, most notable in the left adnexal region. IMPRESSION: 1. No IUP identified. 2. Trace volume of free fluid adjacent to the left adnexa, presumably physiologic. Fifty Electronically Signed   By: Trudie Reedaniel  Entrikin M.D.   On: 01/03/2015 21:49    MAU course/MDM: Quantitative hCG ordered  Abnormal rise in Quant, but hemodynamically stable. Discussed quants, US, stable exam and VS w/ Dr. Jolayne Pantheronstant. Abnormal rise in quant x 2 C/W failed pregnancy, most likely ectopic. Lengthy discussion w/ pt via interpreter about normal vs abnormal rise in quants, recommendation for MTX Tx and risks of untreated ectopic pregnancy including tubal rupture and death. Pt consents to MTX. CBC and CMET ordered.   MTX  Assessment: 749w2d weeks gestation w/ Abnormal rise in quant x 2 rise in Quant C/W ectopic pregnancy  Plan: Discharge home in stable condition. Ectopic precautions Support given Pregnancy loss info given Follow-up Information    [facility]2015.   Why:  For repeat blood work or sooner as needed in emergencies    Contact information:   [address] [id number] [place] [id number] [phone]       Medication List    STOP taking these medications        prenatal multivitamin Tabs tablet      TAKE these medications        oxyCODONE-acetaminophen 5-325 MG tablet  Commonly known as:  PERCOCET  Take 1-2 tablets by mouth every 4 (four) hours as needed for severe pain.       Dorathy KinsmanVirginia Elexis Pollak, CNM 01/15/2015, 9:04 PM  2/3

## 2015-01-18 ENCOUNTER — Inpatient Hospital Stay (HOSPITAL_COMMUNITY)
Admission: AD | Admit: 2015-01-18 | Discharge: 2015-01-18 | Disposition: A | Discharge status: Home / Self Care | Payer: Medicaid Other | Source: Ambulatory Visit | Attending: Obstetrics & Gynecology | Admitting: Obstetrics & Gynecology

## 2015-01-18 DIAGNOSIS — O009 Unspecified ectopic pregnancy without intrauterine pregnancy: Secondary | ICD-10-CM | POA: Diagnosis present

## 2015-01-18 DIAGNOSIS — O0281 Inappropriate change in quantitative human chorionic gonadotropin (hCG) in early pregnancy: Secondary | ICD-10-CM

## 2015-01-18 DIAGNOSIS — R8271 Bacteriuria: Secondary | ICD-10-CM

## 2015-01-18 DIAGNOSIS — O3680X0 Pregnancy with inconclusive fetal viability, not applicable or unspecified: Secondary | ICD-10-CM

## 2015-01-18 LAB — HCG, QUANTITATIVE, PREGNANCY: HCG, BETA CHAIN, QUANT, S: 10892 m[IU]/mL — AB (ref <5)

## 2015-01-18 NOTE — MAU Provider Note (Signed)
History   [id number]   No chief complaint on file.   HPI Caitlyn [name] is a 24 y.o. female G1P0 here for follow-up BHCG day #4 following MTX.[name].  Upon review of the records patient was first seen on 01/03/15 for pelvic pain.   BHCG on that day was 1117.  Ultrasound showed no IUGS.  GC/CT and wet prep were collected.  Results were negative.  Ultrasound was repeated on 01/13/15 with IUGS seen and single eccentric intrauterine sac-like structure.   Pt opted to return in 48 hours for BHCG.  Last seen in MAU on 01/15/15.      BHCG was 8453.  Pt was counseled regarding risk of ectopic and offered MTX and patient consented.  Pt here today with no report of abdominal pain or vaginal bleeding.   All other systems negative.   Patient's last menstrual period was 11/18/2014.  OB History  Gravida Para Term Preterm AB SAB TAB Ectopic Multiple Living  1             # Outcome Date GA Lbr Len/2nd Weight Sex Delivery Anes PTL Lv  1 Current               Past Medical History  Diagnosis Date  . Medical history non-contributory     No family history on file.  Social History   Social History  . Marital Status: Married    Spouse Name: N/A  . Number of Children: N/A  . Years of Education: N/A   Social History Main Topics  . Smoking status: Never Smoker   . Smokeless tobacco: Not on file  . Alcohol Use: No  . Drug Use: No  . Sexual Activity: Yes    Birth Control/ Protection: None     Comment: last intercourse Jan 13 2015   Other Topics Concern  . Not on file   Social History Narrative    No Known Allergies  No current facility-administered medications on file prior to encounter.   Current Outpatient Prescriptions on File Prior to Encounter  Medication Sig Dispense Refill  . oxyCODONE-acetaminophen (PERCOCET) 5-325 MG tablet Take 1-2 tablets by mouth every 4 (four) hours as needed for severe pain. 20 tablet 0     Physical Exam   Filed Vitals:   01/18/15 1940 01/18/15 1941  BP:   135/75  Pulse: 99   Temp: 97.6 F (36.4 C)   Resp: 18   Height: 5\' 4"  (1.626 m)   Weight: 61.508 kg (135 lb 9.6 oz)     Physical Exam  Constitutional: She is oriented to person, place, and time. She appears well-developed and well-nourished. No distress.  HENT:  Head: Normocephalic.  Neck: Neck supple.  Respiratory: Effort normal and breath sounds normal.  Neurological: She is alert and oriented to person, place, and time. She has normal reflexes.  Skin: Skin is warm and dry.  Psychiatric: She has a normal mood and affect.    MAU Course  Procedures  MDM Results for orders placed or performed during the hospital encounter of 01/18/15 (from the past 24 hour(s))  hCG, quantitative, pregnancy     Status: Abnormal   Collection Time: 01/18/15  7:15 PM  Result Value Ref Range   hCG, Beta Chain, Sharene ButtersQuant, S [id number] (H) <5 mIU/mL   2035 Consulted with Dr. Despina HiddenEure > Reviewed HPI/Exam/labs > return on day #7 for repeat BHCG   Assessment and Plan  24 y.o. G1P0 at 3072w5d wks Pregnancy Follow-up BHCG - Post MTX Pregnancy  of Unknown Location  Plan: Discharge to home Return on 01/21/15 for repeat BHCG Reviewed ectopic precautions  Caitlyn [name], CNM 01/18/2015 8:39 PM

## 2015-01-18 NOTE — MAU Note (Signed)
Here for follow up BHCG. Denies any pain or bleeding or any other problems

## 2015-01-18 NOTE — MAU Note (Signed)
Margarita MailW. Karim CNM in Triage to discuss test resutls and d/c plan with pt. Pt d/c home from Triage

## 2015-01-21 ENCOUNTER — Inpatient Hospital Stay (HOSPITAL_COMMUNITY)
Admission: AD | Admit: 2015-01-21 | Discharge: 2015-01-22 | Disposition: A | Discharge status: Home / Self Care | Payer: Medicaid Other | Source: Ambulatory Visit | Attending: Family Medicine | Admitting: Family Medicine

## 2015-01-21 DIAGNOSIS — O001 Tubal pregnancy without intrauterine pregnancy: Secondary | ICD-10-CM

## 2015-01-21 DIAGNOSIS — O00109 Unspecified tubal pregnancy without intrauterine pregnancy: Secondary | ICD-10-CM

## 2015-01-21 DIAGNOSIS — O009 Unspecified ectopic pregnancy without intrauterine pregnancy: Secondary | ICD-10-CM | POA: Diagnosis not present

## 2015-01-21 LAB — HCG, QUANTITATIVE, PREGNANCY: hCG, Beta Chain, Quant, S: 5974 m[IU]/mL — ABNORMAL HIGH (ref <5)

## 2015-01-21 MED ORDER — LACTATED RINGERS IV BOLUS (SEPSIS): 1000.0000 mL | Freq: Once | INTRAVENOUS | Status: DC

## 2015-01-21 NOTE — MAU Provider Note (Signed)
  History     CSN: [id number]  Arrival date and time: 01/21/15 2217   None     Chief Complaint  Patient presents with  . Labs Only   HPI  Caitlyn [name] is [name] 24 y.o. G1P0 at 1367w1d who presents today for day #7 after MTX for ectopic pregnancy. Review of the chart shows that she was seen on 10/27 with pelvic pain. On 10/29 HCG had doubled. Returned on 01/13/15 and had HCG and US. US showed:  1. Single intrauterine sac-like structure measuring 5 weeks 3 days by mean sac diameter, with no definitive features of pregnancy such as [name] yolk sac or embryo, which could be due to the early gestational age. Continued close clinical follow-up with serial serum beta HCG monitoring and follow-up obstetric scan in 2-3 weeks is advised. 2. No suspicious maternal ovarian or adnexal findings.  Patient seen on 01/15/15, and HCG did not double as expected. She was given MTX on 01/15/15. Day 4 HCG was still rising. Patient is here for day 7 FU now. She reports some off and on cramping. She had some brown spotting, but that has stopped now.   Past Medical History  Diagnosis Date  . Medical history non-contributory     Past Surgical History  Procedure Laterality Date  . No past surgeries      No family history on file.  Social History  Substance Use Topics  . Smoking status: Never Smoker   . Smokeless tobacco: Not on file  . Alcohol Use: No    Allergies: No Known Allergies  Prescriptions prior to admission  Medication Sig Dispense Refill Last Dose  . oxyCODONE-acetaminophen (PERCOCET) 5-325 MG tablet Take 1-2 tablets by mouth every 4 (four) hours as needed for severe pain. 20 tablet 0     Review of Systems  Constitutional: Negative for fever.  Gastrointestinal: Positive for abdominal pain. Negative for nausea, vomiting, diarrhea and constipation.  Genitourinary: Negative for dysuria, urgency and frequency.   Physical Exam   Blood pressure 130/71, pulse 82, temperature 98.2 F (36.8 C),  temperature source Oral, resp. rate 16, height 5\' 4"  (1.626 m), weight 61.236 kg (135 lb), last menstrual period 11/18/2014, SpO2 99 %.  Physical Exam Results for Caitlyn [name], Caitlyn [name] (MRN [id number]) as of 01/21/2015 23:31  Ref. Range 01/18/2015 19:15 01/21/2015 22:30  HCG, Beta Chain, Quant, S Latest Ref Range: <5 mIU/mL 10892 (H) 5974 (H)   MAU Course  Procedures  MDM 2341: D/W Dr. Adrian BlackwaterStinson, ok for weekly follow up.   Assessment and Plan   1. Ectopic pregnancy, tubal    DC home Comfort measures reviewed  Bleeding precautions Ectopic precautions RX: none  Return to MAU as needed   Follow-up Information    [facility] In 1 week.   Specialty:  Obstetrics and Gynecology   Contact information:   [address] [place] [id number] [phone]        Tawnya CrookHogan, Zoya Sprecher Donovan 01/21/2015, 11:32 PM

## 2015-01-21 NOTE — MAU Note (Signed)
Pt here for follow up hcg. Received MTX on 01/13/2015. Having mild cramping-new today. Having brownish discharge-light, also new.

## 2015-01-22 DIAGNOSIS — O009 Unspecified ectopic pregnancy without intrauterine pregnancy: Secondary | ICD-10-CM | POA: Diagnosis not present

## 2015-01-22 NOTE — Discharge Instructions (Signed)
Methotrexate Treatment for an Ectopic Pregnancy, Care After °Refer to this sheet in the next few weeks. These instructions provide you with information on caring for yourself after your procedure. Your health care provider may also give you more specific instructions. Your treatment has been planned according to current medical practices, but problems sometimes occur. Call your health care provider if you have any problems or questions after your procedure. °WHAT TO EXPECT AFTER THE PROCEDURE °You may have some abdominal cramping, vaginal bleeding, and fatigue in the first few days after taking methotrexate. Some other possible side effects of methotrexate include: °· Nausea. °· Vomiting. °· Diarrhea. °· Mouth sores. °· Swelling or irritation of the lining of your lungs (pneumonitis). °· Liver damage. °· Hair loss. °HOME CARE INSTRUCTIONS  °After you have received the methotrexate medicine, you need to be careful of your activities and watch your condition for several weeks. It may take 1 week before your hormone levels return to normal. °· Keep all follow-up appointments as directed by your health care provider. °· Avoid traveling too far away from your health care provider. °· Do not have sexual intercourse until your health care provider says it is safe to do so. °· You may resume your usual diet. °· Limit strenuous activity. °· Do not take folic acid, prenatal vitamins, or other vitamins that contain folic acid. °· Do not take aspirin, ibuprofen, or naproxen (nonsteroidal anti-inflammatory drugs [NSAIDs]). °· Do not drink alcohol. °SEEK MEDICAL CARE IF:  °· You cannot control your nausea and vomiting. °· You cannot control your diarrhea. °· You have sores in your mouth and want treatment. °· You need pain medicine for your abdominal pain. °· You have a rash. °· You are having a reaction to the medicine. °SEEK IMMEDIATE MEDICAL CARE IF:  °· You have increasing abdominal or pelvic pain. °· You notice increased  bleeding. °· You feel light-headed, or you faint. °· You have shortness of breath. °· Your heart rate increases. °· You have a cough. °· You have chills. °· You have a fever. °  °This information is not intended to replace advice given to you by your health care provider. Make sure you discuss any questions you have with your health care provider. °  °Document Released: 02/12/2011 Document Revised: 02/28/2013 Document Reviewed: 12/12/2012 °Elsevier Interactive Patient Education ©2016 [employer] Inc[employer] ° °

## 2015-01-28 ENCOUNTER — Other Ambulatory Visit: Payer: Self-pay

## 2015-01-30 ENCOUNTER — Inpatient Hospital Stay (HOSPITAL_COMMUNITY)
Admission: AD | Admit: 2015-01-30 | Discharge: 2015-01-30 | Disposition: A | Discharge status: Home / Self Care | Payer: Medicaid Other | Source: Ambulatory Visit | Attending: Family Medicine | Admitting: Family Medicine

## 2015-01-30 DIAGNOSIS — Z5181 Encounter for therapeutic drug level monitoring: Secondary | ICD-10-CM | POA: Diagnosis not present

## 2015-01-30 DIAGNOSIS — O009 Unspecified ectopic pregnancy without intrauterine pregnancy: Secondary | ICD-10-CM | POA: Diagnosis not present

## 2015-01-30 DIAGNOSIS — Z79899 Other long term (current) drug therapy: Secondary | ICD-10-CM

## 2015-01-30 LAB — HCG, QUANTITATIVE, PREGNANCY: hCG, Beta Chain, Quant, S: 154 m[IU]/mL — ABNORMAL HIGH (ref <5)

## 2015-01-30 NOTE — MAU Note (Signed)
Here for F/U BHCG after MTX. Pt denies pain reports a little bit of brown discharge.

## 2015-01-30 NOTE — MAU Provider Note (Signed)
Pt here for fu HCG after receiving methotrexate 12 days ago.  She denies vaginal bleeding, abdominal pain.  Admits to no problems whatsoever.   At that time her HCG level was >10,000.   9 days ago her HCG was >5000 Today, HCG is 154.    Pt to return for HCG in 7 days.   Precautions to return to MAU for increased pain, bleeding, weakness.

## 2015-02-10 ENCOUNTER — Inpatient Hospital Stay (HOSPITAL_COMMUNITY)
Admission: AD | Admit: 2015-02-10 | Discharge: 2015-02-10 | Disposition: A | Discharge status: Home / Self Care | Payer: Medicaid Other | Source: Ambulatory Visit | Attending: Obstetrics & Gynecology | Admitting: Obstetrics & Gynecology

## 2015-02-10 DIAGNOSIS — O009 Unspecified ectopic pregnancy without intrauterine pregnancy: Secondary | ICD-10-CM | POA: Insufficient documentation

## 2015-02-10 LAB — HCG, QUANTITATIVE, PREGNANCY: hCG, Beta Chain, Quant, S: 11 m[IU]/mL — ABNORMAL HIGH (ref <5)

## 2015-02-10 NOTE — Discharge Instructions (Signed)
Your HCG level today was 11. Follow-up in the clinic on the ground floor for bloodwork weekly until your quant is less than 1.

## 2015-02-10 NOTE — MAU Note (Signed)
Pt here for follow up labs. Denies pain or bleeding. Denies other problems at this time.

## 2015-02-10 NOTE — MAU Provider Note (Signed)
History   Chief Complaint:  Labs Only   Caitlyn [name] is  24 y.o. G1P0 Patient's last menstrual period was 11/18/2014.[name]. Patient is here for follow up of quantitative HCG after  MTX for ectopic pregnancy given 01/15/15. Since her last visit, the patient is without new complaint.     ROS Abdomin Pain: None Vaginal bleeding: none now.   Passage of clots or tissue: None Dizziness: None  Her previous Quantitative HCG values are:  Results for Caitlyn [name], Caitlyn [name] (MRN [id number]) as of 02/10/2015 20:25  Ref. Range 01/03/2015 20:23 01/05/2015 20:05 01/13/2015 15:15 01/15/2015 18:33 01/18/2015 19:15 01/21/2015 22:30 01/30/2015 17:00  HCG, Beta Chain, Quant, S Latest Ref Range: <5 mIU/mL 1117 (H) 1980 (H) 6104 (H) 8453 (H) MTX  [id number] (H) 5974 (H) 154 (H)    Physical Exam   BP 134/68 mmHg  [name] 93  Temp(Src) 98.2 F (36.8 C) (Oral)  Resp 16  Ht 5\' 4"  (1.626 m)  Wt 135 lb (61.236 kg)  BMI 23.16 kg/m2  SpO2 99%  LMP 11/18/2014 Constitutional: Well-nourished female in no apparent distress. No pallor Neuro: Alert and oriented 4 Cardiovascular: Normal rate Respiratory: Normal effort and rate   Labs: Results for orders placed or performed during the hospital encounter of 02/10/15 (from the past 24 hour(s))  hCG, quantitative, pregnancy   Collection Time: 02/10/15  7:25 PM  Result Value Ref Range   hCG, Beta Chain, Quant, S 11 (H) <5 mIU/mL    Ultrasound Studies:   [place] Ob Transvaginal  01/13/2015  CLINICAL DATA:  24 year old pregnant female with vaginal spotting. EDC by LMP: 08/25/2015, projecting to an expected gestational age of [redacted] weeks 0 days. EXAM: TRANSVAGINAL OB ULTRASOUND TECHNIQUE: Transvaginal ultrasound was performed for complete evaluation of the gestation as well as the maternal uterus, adnexal regions, and pelvic cul-de-sac. COMPARISON:  01/03/2015 obstetric scan. FINDINGS: Intrauterine gestational sac: There is [name] single eccentric intrauterine sac-like structure with double decidual  sac sign, which appears normal in shape. No perigestational bleed. Yolk sac:  Not visualized. Embryo:  Not visualized. Cardiac Activity: Not visualized. MSD: 6.9  mm   5 w   3  d              US EDC: 09/12/2015 Maternal uterus/adnexae: No uterine fibroids. Maternal left ovary measures 2.9 x 2.3 x 1.7 cm. Maternal right ovary measures 3.2 x 1.8 x 2.8 cm and appears to contain [name] corpus luteum. No suspicious ovarian or adnexal masses. No abnormal free fluid in the pelvis. IMPRESSION: 1. Single intrauterine sac-like structure measuring 5 weeks 3 days by mean sac diameter, with no definitive features of pregnancy such as [name] yolk sac or embryo, which could be due to the early gestational age. Continued close clinical follow-up with serial serum beta HCG monitoring and follow-up obstetric scan in 2-3 weeks is advised. 2. No suspicious maternal ovarian or adnexal findings. Electronically Signed   By: Delbert PhenixJason [name] Poff M.D.   On: 01/13/2015 15:49    MAU course/MDM: Adequate drop in Quant after MTX. hemodynamically stable.  Assessment: Adequate drop in Quant after MTX for ectopic pregnancy  Plan: Discharge home in stable condition. Need to follow quants until <1.      Follow-up Information    [facility] In 1 week.   Specialty:  Obstetrics and Gynecology   Why:  For repeat blood work   Contact information:   [address] [place] [id number] [phone]  Medication List    TAKE these medications        oxyCODONE-acetaminophen 5-325 MG tablet  Commonly known as:  PERCOCET  Take 1-2 tablets by mouth every 4 (four) hours as needed for severe pain.        Dorathy Kinsman, CNM 02/10/2015, 8:55 PM  2/3

## 2015-02-18 ENCOUNTER — Other Ambulatory Visit: Payer: Self-pay

## 2015-02-19 ENCOUNTER — Telehealth: Payer: Self-pay

## 2015-02-19 NOTE — Telephone Encounter (Signed)
[employer] #  [phone] Called pt and LM to return our call to the Clinics it's concerning an appt.

## 2015-02-21 NOTE — Telephone Encounter (Signed)
Called patient with [employer] [phone], no answer- left message stating we are trying to reach you with results, please call us back at the clinics

## 2015-02-25 ENCOUNTER — Other Ambulatory Visit: Payer: Self-pay

## 2015-02-25 DIAGNOSIS — O039 Complete or unspecified spontaneous abortion without complication: Secondary | ICD-10-CM

## 2015-02-25 NOTE — Telephone Encounter (Signed)
Patient in today for lab draw, will call patient with results when available.

## 2015-02-26 LAB — HCG, QUANTITATIVE, PREGNANCY: hCG, Beta Chain, Quant, S: <2 m[IU]/mL

## 2015-02-28 ENCOUNTER — Telehealth: Payer: Self-pay | Admitting: General Practice

## 2015-02-28 NOTE — Telephone Encounter (Signed)
Per Dr Jolayne Pantheronstant, patient has had complete resolution of pregnancy. Called patient with [employer] [phone], no answer- left message stating we are trying to reach you with results, please call us back at the clinics

## 2015-03-06 ENCOUNTER — Encounter: Payer: Self-pay | Admitting: General Practice

## 2015-03-06 NOTE — Telephone Encounter (Signed)
Called patient with [employer] [phone], no answer- left message stating we are trying to reach you with results, please call us back at the clinics. Will send letter

## 2015-03-13 ENCOUNTER — Encounter (HOSPITAL_COMMUNITY): Payer: Self-pay

## 2015-03-13 ENCOUNTER — Inpatient Hospital Stay (HOSPITAL_COMMUNITY): Payer: Medicaid Other

## 2015-03-13 ENCOUNTER — Inpatient Hospital Stay (HOSPITAL_COMMUNITY)
Admission: AD | Admit: 2015-03-13 | Discharge: 2015-03-14 | Disposition: A | Discharge status: Home / Self Care | Payer: Medicaid Other | Source: Ambulatory Visit | Attending: Obstetrics & Gynecology | Admitting: Obstetrics & Gynecology

## 2015-03-13 DIAGNOSIS — O9989 Other specified diseases and conditions complicating pregnancy, childbirth and the puerperium: Secondary | ICD-10-CM | POA: Diagnosis not present

## 2015-03-13 DIAGNOSIS — O26899 Other specified pregnancy related conditions, unspecified trimester: Secondary | ICD-10-CM | POA: Diagnosis not present

## 2015-03-13 DIAGNOSIS — R109 Unspecified abdominal pain: Secondary | ICD-10-CM | POA: Insufficient documentation

## 2015-03-13 DIAGNOSIS — O3680X0 Pregnancy with inconclusive fetal viability, not applicable or unspecified: Secondary | ICD-10-CM

## 2015-03-13 LAB — URINALYSIS, ROUTINE W REFLEX MICROSCOPIC
Bilirubin Urine: NEGATIVE
GLUCOSE, UA: NEGATIVE mg/dL
KETONES UR: 15 mg/dL — AB
LEUKOCYTES UA: NEGATIVE
NITRITE: NEGATIVE
PH: 6 (ref 5.0–8.0)
Protein, ur: NEGATIVE mg/dL
SPECIFIC GRAVITY, URINE: 1.025 (ref 1.005–1.030)

## 2015-03-13 LAB — CBC
HCT: 36.7 % (ref 36.0–46.0)
HEMOGLOBIN: 12.7 g/dL (ref 12.0–15.0)
MCH: 30.6 pg (ref 26.0–34.0)
MCHC: 34.6 g/dL (ref 30.0–36.0)
MCV: 88.4 fL (ref 78.0–100.0)
PLATELETS: 223 10*3/uL (ref 150–400)
RBC: 4.15 MIL/uL (ref 3.87–5.11)
RDW: 13.6 % (ref 11.5–15.5)
WBC: 12.1 10*3/uL — AB (ref 4.0–10.5)

## 2015-03-13 LAB — URINE MICROSCOPIC-ADD ON

## 2015-03-13 LAB — HCG, QUANTITATIVE, PREGNANCY: hCG, Beta Chain, Quant, S: 1070 m[IU]/mL — ABNORMAL HIGH (ref <5)

## 2015-03-13 LAB — POCT PREGNANCY, URINE: Preg Test, Ur: POSITIVE — AB

## 2015-03-13 MED ORDER — PROMETHAZINE HCL 25 MG/ML IJ SOLN: 25.0000 mg | Freq: Once | INTRAMUSCULAR | Status: DC

## 2015-03-13 NOTE — MAU Note (Signed)
Pt states that she had +upt yesterday. States that she feels dizzy and nauseous, but has not vomited. Has not had period since MTX back in November. Some mild abdominal pain that she rates 2/10. Had some brown discharge 2 days ago.

## 2015-03-13 NOTE — MAU Provider Note (Signed)
History     CSN: [id number]  Arrival date and time: 03/13/15 2158   First Provider Initiated Contact with Patient 03/13/15 2327      Chief Complaint  Patient presents with  . Nausea  . Abdominal Pain   HPI Comments: Caitlyn [name] is a 25 y.o.G2P0010 at unknown who presents today with abdominal pain x about a week, and dizziness for about two weeks. She was recently treated for an ectopic pregnancy, and was followed until resolution. She has not had a period since then, but had a +UPT at home.   Abdominal Pain This is a new problem. The current episode started yesterday. The onset quality is gradual. The problem occurs intermittently. The problem has been unchanged. The pain is located in the suprapubic region. The pain is mild. The abdominal pain does not radiate. Associated symptoms include nausea. Pertinent negatives include no constipation, diarrhea, dysuria, fever, frequency, headaches or vomiting. Nothing aggravates the pain. The pain is relieved by nothing. She has tried nothing for the symptoms.  Dizziness This is a new problem. The current episode started 1 to 4 weeks ago. The problem occurs intermittently. The problem has been unchanged. Associated symptoms include abdominal pain and nausea. Pertinent negatives include no chills, fever, headaches or vomiting. The symptoms are aggravated by bending and standing. She has tried nothing for the symptoms.     Past Medical History  Diagnosis Date  . Medical history non-contributory     Past Surgical History  Procedure Laterality Date  . No past surgeries      No family history on file.  Social History  Substance Use Topics  . Smoking status: Never Smoker   . Smokeless tobacco: Not on file  . Alcohol Use: No    Allergies: No Known Allergies  Prescriptions prior to admission  Medication Sig Dispense Refill Last Dose  . oxyCODONE-acetaminophen (PERCOCET) 5-325 MG tablet Take 1-2 tablets by mouth every 4 (four) hours as  needed for severe pain. 20 tablet 0     Review of Systems  Constitutional: Negative for fever and chills.  Eyes: Negative for blurred vision.  Gastrointestinal: Positive for nausea and abdominal pain. Negative for vomiting, diarrhea and constipation.  Genitourinary: Negative for dysuria, urgency and frequency.  Neurological: Positive for dizziness. Negative for loss of consciousness and headaches.   Physical Exam   Blood pressure 144/72, pulse 113, temperature 98.2 F (36.8 C), temperature source Oral, resp. rate 18, height 5\' 3"  (1.6 m), weight 63.141 kg (139 lb 3.2 oz), last menstrual period 11/18/2014, SpO2 100 %, unknown if currently breastfeeding.  Physical Exam  Nursing note and vitals reviewed. Constitutional: She is oriented to person, place, and time. She appears well-developed and well-nourished. No distress.  HENT:  Head: Normocephalic.  Cardiovascular: Normal rate.   Respiratory: Effort normal.  GI: Soft. There is no tenderness. There is no rebound.  Genitourinary:   External: no lesion Vagina: small amount of white discharge Cervix: pink, smooth, no CMT Uterus: NSSC Adnexa: NT   Neurological: She is alert and oriented to person, place, and time.  Skin: Skin is warm and dry.  Psychiatric: She has a normal mood and affect.   Results for orders placed or performed during the hospital encounter of 03/13/15 (from the past 24 hour(s))  Urinalysis, Routine w reflex microscopic ([facility])     Status: Abnormal   Collection Time: 03/13/15 10:23 PM  Result Value Ref Range   Color, Urine YELLOW YELLOW   APPearance CLEAR  CLEAR   Specific Gravity, Urine 1.025 1.005 - 1.030   pH 6.0 5.0 - 8.0   Glucose, UA NEGATIVE NEGATIVE mg/dL   Hgb urine dipstick SMALL (A) NEGATIVE   Bilirubin Urine NEGATIVE NEGATIVE   Ketones, ur 15 (A) NEGATIVE mg/dL   Protein, ur NEGATIVE NEGATIVE mg/dL   Nitrite NEGATIVE NEGATIVE   Leukocytes, UA NEGATIVE NEGATIVE  Urine microscopic-add on      Status: Abnormal   Collection Time: 03/13/15 10:23 PM  Result Value Ref Range   Squamous Epithelial / LPF 0-5 (A) NONE SEEN   WBC, UA 0-5 0 - 5 WBC/hpf   RBC / HPF 0-5 0 - 5 RBC/hpf   Bacteria, UA FEW (A) NONE SEEN  Pregnancy, urine POC     Status: Abnormal   Collection Time: 03/13/15 10:46 PM  Result Value Ref Range   Preg Test, Ur POSITIVE (A) NEGATIVE  CBC     Status: Abnormal   Collection Time: 03/13/15 11:03 PM  Result Value Ref Range   WBC 12.1 (H) 4.0 - 10.5 K/uL   RBC 4.15 3.87 - 5.11 MIL/uL   Hemoglobin 12.7 12.0 - 15.0 g/dL   HCT 16.1 09.6 - 04.5 %   MCV 88.4 78.0 - 100.0 fL   MCH 30.6 26.0 - 34.0 pg   MCHC 34.6 30.0 - 36.0 g/dL   RDW 40.9 81.1 - 91.4 %   Platelets 223 150 - 400 K/uL  hCG, quantitative, pregnancy     Status: Abnormal   Collection Time: 03/13/15 11:04 PM  Result Value Ref Range   hCG, Beta Chain, Quant, S 1070 (H) <5 mIU/mL  Rh IG workup (includes ABO/Rh)     Status: None (Preliminary result)   Collection Time: 03/13/15 11:04 PM  Result Value Ref Range   Gestational Age(Wks) 4    ABO/RH(D) O NEG    Antibody Screen POS    Antibody Identification PASSIVELY ACQUIRED ANTI-D    DAT, IgG NEG    Unit Number [id number]    Blood Component Type RHIG    Unit division 00    Status of Unit ALLOCATED    Transfusion Status OK TO TRANSFUSE   Wet prep, genital     Status: Abnormal   Collection Time: 03/13/15 11:40 PM  Result Value Ref Range   Yeast Wet Prep HPF POC NONE SEEN NONE SEEN   Trich, Wet Prep NONE SEEN NONE SEEN   Clue Cells Wet Prep HPF POC NONE SEEN NONE SEEN   WBC, Wet Prep HPF POC MANY (A) NONE SEEN   Sperm NONE SEEN   ;US Ob Transvaginal  03/14/2015  CLINICAL DATA:  Pregnant patient with abdominal pain and spotting for 2 days. History of ectopic pregnancy November 2016 post methotrexate. EXAM: TRANSVAGINAL OB ULTRASOUND TECHNIQUE: Transvaginal ultrasound was performed for complete evaluation of the gestation as well as the maternal uterus,  adnexal regions, and pelvic cul-de-sac. COMPARISON:  None this pregnancy. FINDINGS: Intrauterine gestational sac:  Not present. Yolk sac:  Not present. Embryo:  Not present. Maternal uterus/adnexae: Endometrium appears thickened measuring 16 mm. No fluid in the endometrial canal. The left ovary appears normal measuring 4.2 x 2.2 x 2.0 cm. The right ovary appears normal measuring 2.4 x 1.6 x 2.4 cm. Moderate free fluid in the pelvis is anechoic consistent with simple fluid. IMPRESSION: No intrauterine pregnancy. Moderate simple free fluid in the pelvis, without adnexal mass or sonographic finding of ectopic pregnancy. Findings consistent with pregnancy of unknown location. Recommend continued trending of  beta HCG and follow-up ultrasound as indicated. Electronically Signed   By: Rubye Oaks M.D.   On: 03/14/2015 00:23    MAU Course  Procedures  MDM   Assessment and Plan   1. Pregnancy of unknown anatomic location   2. Abdominal pain affecting pregnancy    DC home Comfort measures reviewed  1st Trimester precautions  Bleeding precautions Ectopic precautions RX: none  Return to MAU as needed   Follow-up Information    Follow up with [facility].   Why:  SATURDAY 03/15/14 in the morning    Contact information:   [address] [id number] mc [place] [address] [phone]        Tawnya Crook 03/13/2015, 11:28 PM

## 2015-03-14 DIAGNOSIS — R109 Unspecified abdominal pain: Secondary | ICD-10-CM | POA: Diagnosis not present

## 2015-03-14 DIAGNOSIS — O9989 Other specified diseases and conditions complicating pregnancy, childbirth and the puerperium: Secondary | ICD-10-CM | POA: Diagnosis not present

## 2015-03-14 LAB — RPR: RPR Ser Ql: NONREACTIVE

## 2015-03-14 LAB — RH IG WORKUP (INCLUDES ABO/RH)
ABO/RH(D): O NEG
ANTIBODY SCREEN: POSITIVE
DAT, IgG: NEGATIVE
Gestational Age(Wks): 4
Unit division: 0

## 2015-03-14 LAB — WET PREP, GENITAL
CLUE CELLS WET PREP: NONE SEEN
Sperm: NONE SEEN
TRICH WET PREP: NONE SEEN
Yeast Wet Prep HPF POC: NONE SEEN

## 2015-03-14 LAB — HIV ANTIBODY (ROUTINE TESTING W REFLEX): HIV SCREEN 4TH GENERATION: NONREACTIVE

## 2015-03-14 NOTE — Discharge Instructions (Signed)

## 2015-03-15 LAB — GC/CHLAMYDIA PROBE AMP (~~LOC~~) NOT AT ARMC
CHLAMYDIA, DNA PROBE: NEGATIVE
NEISSERIA GONORRHEA: NEGATIVE

## 2015-03-18 ENCOUNTER — Inpatient Hospital Stay (HOSPITAL_COMMUNITY)
Admission: AD | Admit: 2015-03-18 | Discharge: 2015-03-18 | Disposition: A | Discharge status: Home / Self Care | Payer: Medicaid Other | Source: Ambulatory Visit | Attending: Family Medicine | Admitting: Family Medicine

## 2015-03-18 ENCOUNTER — Encounter (HOSPITAL_COMMUNITY): Payer: Self-pay | Admitting: Student

## 2015-03-18 DIAGNOSIS — Z331 Pregnant state, incidental: Secondary | ICD-10-CM | POA: Insufficient documentation

## 2015-03-18 DIAGNOSIS — O0281 Inappropriate change in quantitative human chorionic gonadotropin (hCG) in early pregnancy: Secondary | ICD-10-CM | POA: Diagnosis not present

## 2015-03-18 DIAGNOSIS — O3680X0 Pregnancy with inconclusive fetal viability, not applicable or unspecified: Secondary | ICD-10-CM

## 2015-03-18 LAB — HCG, QUANTITATIVE, PREGNANCY: hCG, Beta Chain, Quant, S: 6321 m[IU]/mL — ABNORMAL HIGH (ref <5)

## 2015-03-18 NOTE — MAU Note (Addendum)
Pt here for follow up lab work. Was suppose to come Saturday, but was unable to come due to weather. States that she is having some intermittent pain-has had this pain since visit on 03/13/15. States that it comes once a day but is stronger than it was then. Rates 5/10 when it comes. No pain now. No vag bleeding.

## 2015-03-18 NOTE — MAU Provider Note (Signed)
Ms. Caitlyn [name]  is [name] 25 y.o. G3P0010 at Unknown who presents to MAU today for follow-up quant hCG after 48 hours. The patient was supposed to follow-up on Saturday, but could not due to inclement weather. The patient states intermittent lower abdominal pain rated at 5/10 at the worst. She denies pain or vaginal bleeding today.   BP 133/82 mmHg  Pulse 101  Temp(Src) 98.1 F (36.7 C)  Resp 18  Ht 5\' 3"  (1.6 m)  Wt 139 lb (63.05 kg)  BMI 24.63 kg/m2  SpO2 100%  LMP 11/18/2014 (Approximate)  CONSTITUTIONAL: Well-developed, well-nourished female in no acute distress.  ENT: External right and left ear normal.  EYES: EOM intact, conjunctivae normal.  MUSCULOSKELETAL: Normal range of motion.  CARDIOVASCULAR: Regular heart rate RESPIRATORY: Normal effort NEUROLOGICAL: Alert and oriented to person, place, and time.  SKIN: Skin is warm and dry. No rash noted. Not diaphoretic. No erythema. No pallor. PSYCH: Normal mood and affect. Normal behavior. Normal judgment and thought content.  Results for Caitlyn [name], Caitlyn [name] (MRN [id number]) as of 03/18/2015 20:52  Ref. Range 03/13/2015 23:04 03/13/2015 23:40 03/14/2015 00:09 03/18/2015 19:45  HCG, Beta Chain, Quant, S Latest Ref Range: <5 mIU/mL 1070 (H)   6321 (H)   [name]: Appropriate rise in quant hCG   P: Discharge home First trimester/ectopic precautions discussed Patient will return for follow-up US in 1 week. Order placed. They will call the patient with an appointment time. Patient will follow-up with [facility] for results after US.  Patient may return to MAU as needed or if her condition were to change or worsen   Marny LowensteinJulie N Maelin Kurkowski, PA-C 03/18/2015 8:49 PM

## 2015-03-18 NOTE — Discharge Instructions (Signed)

## 2015-03-21 ENCOUNTER — Inpatient Hospital Stay (HOSPITAL_COMMUNITY): Payer: Medicaid Other

## 2015-03-21 ENCOUNTER — Inpatient Hospital Stay (HOSPITAL_COMMUNITY)
Admission: AD | Admit: 2015-03-21 | Discharge: 2015-03-22 | Disposition: A | Discharge status: Home / Self Care | Payer: Medicaid Other | Source: Ambulatory Visit | Attending: Obstetrics & Gynecology | Admitting: Obstetrics & Gynecology

## 2015-03-21 DIAGNOSIS — O209 Hemorrhage in early pregnancy, unspecified: Secondary | ICD-10-CM | POA: Diagnosis not present

## 2015-03-21 DIAGNOSIS — O468X2 Other antepartum hemorrhage, second trimester: Secondary | ICD-10-CM | POA: Diagnosis present

## 2015-03-21 DIAGNOSIS — O4692 Antepartum hemorrhage, unspecified, second trimester: Secondary | ICD-10-CM | POA: Diagnosis not present

## 2015-03-21 DIAGNOSIS — O3680X0 Pregnancy with inconclusive fetal viability, not applicable or unspecified: Secondary | ICD-10-CM

## 2015-03-21 LAB — CBC
HEMATOCRIT: 36.6 % (ref 36.0–46.0)
HEMOGLOBIN: 12.8 g/dL (ref 12.0–15.0)
MCH: 30.5 pg (ref 26.0–34.0)
MCHC: 35 g/dL (ref 30.0–36.0)
MCV: 87.4 fL (ref 78.0–100.0)
Platelets: 218 10*3/uL (ref 150–400)
RBC: 4.19 MIL/uL (ref 3.87–5.11)
RDW: 13.4 % (ref 11.5–15.5)
WBC: 12 10*3/uL — ABNORMAL HIGH (ref 4.0–10.5)

## 2015-03-21 NOTE — MAU Provider Note (Signed)
History     CSN: [id number]  Arrival date and time: 03/21/15 2321   First Provider Initiated Contact with Patient 03/21/15 2341      Chief Complaint  Patient presents with  . Vaginal Bleeding   Vaginal Bleeding The patient's primary symptoms include vaginal bleeding. The patient's pertinent negatives include no pelvic pain. This is a new problem. The current episode started today. The problem occurs intermittently. The problem has been unchanged. The patient is experiencing no pain. She is pregnant. Pertinent negatives include no abdominal pain, chills, constipation, diarrhea, dysuria, fever, frequency, nausea, urgency or vomiting. The vaginal discharge was bloody. The vaginal bleeding is spotting. She has not been passing clots. She has not been passing tissue. Nothing aggravates the symptoms. She has tried nothing for the symptoms. She is sexually active.     Past Medical History  Diagnosis Date  . Medical history non-contributory     Past Surgical History  Procedure Laterality Date  . No past surgeries      No family history on file.  Social History  Substance Use Topics  . Smoking status: Never Smoker   . Smokeless tobacco: Not on file  . Alcohol Use: No    Allergies: No Known Allergies  Prescriptions prior to admission  Medication Sig Dispense Refill Last Dose  . oxyCODONE-acetaminophen (PERCOCET) 5-325 MG tablet Take 1-2 tablets by mouth every 4 (four) hours as needed for severe pain. 20 tablet 0     Review of Systems  Constitutional: Negative for fever and chills.  Gastrointestinal: Negative for nausea, vomiting, abdominal pain, diarrhea and constipation.  Genitourinary: Positive for vaginal bleeding. Negative for dysuria, urgency, frequency and pelvic pain.   Physical Exam   Blood pressure 129/79, pulse 101, temperature 98.7 F (37.1 C), temperature source Oral, resp. rate 18, height 5\' 2"  (1.575 m), weight 62.823 kg (138 lb 8 oz), last menstrual period  11/18/2014, unknown if currently breastfeeding.  Physical Exam  Nursing note and vitals reviewed. Constitutional: She is oriented to person, place, and time. She appears well-developed and well-nourished. No distress.  HENT:  Head: Normocephalic.  Cardiovascular: Normal rate.   Respiratory: Effort normal.  GI: Soft. There is no tenderness. There is no rebound.  Neurological: She is alert and oriented to person, place, and time.  Skin: Skin is warm and dry.  Psychiatric: She has a normal mood and affect.   Results for orders placed or performed during the hospital encounter of 03/21/15 (from the past 24 hour(s))  CBC     Status: Abnormal   Collection Time: 03/21/15 11:50 PM  Result Value Ref Range   WBC 12.0 (H) 4.0 - 10.5 K/uL   RBC 4.19 3.87 - 5.11 MIL/uL   Hemoglobin 12.8 12.0 - 15.0 g/dL   HCT 40.936.6 81.136.0 - 91.446.0 %   MCV 87.4 78.0 - 100.0 fL   MCH 30.5 26.0 - 34.0 pg   MCHC 35.0 30.0 - 36.0 g/dL   RDW 78.213.4 95.611.5 - 21.315.5 %   Platelets 218 150 - 400 K/uL  Rh IG workup (includes ABO/Rh)     Status: None (Preliminary result)   Collection Time: 03/21/15 11:50 PM  Result Value Ref Range   Gestational Age(Wks) 6    ABO/RH(D) O NEG    Antibody Screen POS    Antibody Identification PENDING    DAT, IgG PENDING    [place] Ob Transvaginal  03/22/2015  CLINICAL DATA:  Follow-up pregnancy of unknown location. Vaginal spotting today. EXAM: TRANSVAGINAL OB ULTRASOUND  TECHNIQUE: Transvaginal ultrasound was performed for complete evaluation of the gestation as well as the maternal uterus, adnexal regions, and pelvic cul-de-sac. COMPARISON:  03/13/2015 FINDINGS: Intrauterine gestational sac: Visualized/normal in shape. Yolk sac:  Not present. Embryo:  Not present. MSD: 7.8  mm   5 w   4  d              [place] EDC: 11/17/2015 Subchorionic hemorrhage:  None visualized. Maternal uterus/adnexae: Both ovaries are visualized and normal in size. Diminished free fluid in the pelvis from prior exam. IMPRESSION:  Probable early intrauterine gestational sac, but no yolk sac, fetal pole, or cardiac activity yet visualized. Recommend follow-up quantitative B-HCG levels and follow-up US in 14 days to confirm and assess viability. This recommendation follows SRU consensus guidelines: Diagnostic Criteria for Nonviable Pregnancy Early in the First Trimester. Malva Limes Med 2013; 161:0960-45. Electronically Signed   By: Rubye Oaks M.D.   On: 03/22/2015 00:58    MAU Course  Procedures  MDM Patient given rhogam today   Assessment and Plan   1. Vaginal bleeding before [redacted] weeks gestation   2. Pregnancy, location unknown    DC home Comfort measures reviewed  1stTrimester precautions  Bleeding precautions Ectopic precautions RX: none  Return to MAU as needed   Follow-up Information    Follow up with [facility] [place] [facility].   Specialty:  Radiology   Why:  as scheduled for [place] next week    Contact information:   [address] [id number] mc [place] [address] [phone]        Tawnya Crook 03/21/2015, 11:42 PM

## 2015-03-21 NOTE — MAU Note (Addendum)
PT  WAS HERE  ON  MON  FOR  QUANT.   -    HAD  BROWN  SPOTS,   AND  CRAMPING   WAS  A 3-4  .     THEN  ON Tuesday  SHE HAD  PINK  SPOTS  ON PAD     AND  STILL PINK  NOW.       CRAMPING-  NONE.   DUE FOR U/S ON 1-17.   PT  SAYS SHE  HAS  PAD ON -  AND  NOTHING  ON IT.

## 2015-03-22 ENCOUNTER — Encounter (HOSPITAL_COMMUNITY): Payer: Self-pay | Admitting: *Deleted

## 2015-03-22 DIAGNOSIS — O209 Hemorrhage in early pregnancy, unspecified: Secondary | ICD-10-CM

## 2015-03-22 MED ORDER — RHO D IMMUNE GLOBULIN 1500 UNIT/2ML IJ SOSY
300.0000 ug | PREFILLED_SYRINGE | Freq: Once | INTRAMUSCULAR | Status: AC
  Administered 2015-03-22: 300 ug via INTRAMUSCULAR
  Filled 2015-03-22: qty 2

## 2015-03-22 NOTE — Discharge Instructions (Signed)

## 2015-03-23 LAB — RH IG WORKUP (INCLUDES ABO/RH)
ABO/RH(D): O NEG
ANTIBODY SCREEN: POSITIVE
DAT, IgG: NEGATIVE
Fetal Screen: NEGATIVE
GESTATIONAL AGE(WKS): 6
UNIT DIVISION: 0

## 2015-03-26 ENCOUNTER — Ambulatory Visit (HOSPITAL_COMMUNITY): Admission: RE | Admit: 2015-03-26 | Payer: Medicaid Other | Source: Ambulatory Visit

## 2016-01-21 ENCOUNTER — Encounter (HOSPITAL_COMMUNITY): Payer: Self-pay

## 2016-02-07 ENCOUNTER — Inpatient Hospital Stay (HOSPITAL_COMMUNITY)
Admission: AD | Admit: 2016-02-07 | Discharge: 2016-02-07 | Disposition: A | Discharge status: Home / Self Care | Payer: Medicaid Other | Source: Ambulatory Visit | Attending: Obstetrics & Gynecology | Admitting: Obstetrics & Gynecology

## 2016-02-07 ENCOUNTER — Encounter (HOSPITAL_COMMUNITY): Payer: Self-pay | Admitting: *Deleted

## 2016-02-07 DIAGNOSIS — O26893 Other specified pregnancy related conditions, third trimester: Secondary | ICD-10-CM | POA: Diagnosis not present

## 2016-02-07 DIAGNOSIS — O133 Gestational [pregnancy-induced] hypertension without significant proteinuria, third trimester: Secondary | ICD-10-CM | POA: Insufficient documentation

## 2016-02-07 DIAGNOSIS — O0933 Supervision of pregnancy with insufficient antenatal care, third trimester: Secondary | ICD-10-CM | POA: Diagnosis not present

## 2016-02-07 DIAGNOSIS — O368130 Decreased fetal movements, third trimester, not applicable or unspecified: Secondary | ICD-10-CM

## 2016-02-07 DIAGNOSIS — O09893 Supervision of other high risk pregnancies, third trimester: Secondary | ICD-10-CM

## 2016-02-07 DIAGNOSIS — Z6791 Unspecified blood type, Rh negative: Secondary | ICD-10-CM | POA: Insufficient documentation

## 2016-02-07 DIAGNOSIS — Z3A35 35 weeks gestation of pregnancy: Secondary | ICD-10-CM | POA: Diagnosis not present

## 2016-02-07 DIAGNOSIS — Z3480 Encounter for supervision of other normal pregnancy, unspecified trimester: Secondary | ICD-10-CM

## 2016-02-07 DIAGNOSIS — O139 Gestational [pregnancy-induced] hypertension without significant proteinuria, unspecified trimester: Secondary | ICD-10-CM | POA: Diagnosis present

## 2016-02-07 HISTORY — DX: Anemia, unspecified: D64.9

## 2016-02-07 LAB — CBC
HCT: 30.9 % — ABNORMAL LOW (ref 36.0–46.0)
Hemoglobin: 10.8 g/dL — ABNORMAL LOW (ref 12.0–15.0)
MCH: 31.6 pg (ref 26.0–34.0)
MCHC: 35 g/dL (ref 30.0–36.0)
MCV: 90.4 fL (ref 78.0–100.0)
PLATELETS: 203 10*3/uL (ref 150–400)
RBC: 3.42 MIL/uL — ABNORMAL LOW (ref 3.87–5.11)
RDW: 13.8 % (ref 11.5–15.5)
WBC: 9.5 10*3/uL (ref 4.0–10.5)

## 2016-02-07 LAB — COMPREHENSIVE METABOLIC PANEL
ALK PHOS: 95 U/L (ref 38–126)
ALT: 13 U/L — ABNORMAL LOW (ref 14–54)
ANION GAP: 9 (ref 5–15)
AST: 15 U/L (ref 15–41)
Albumin: 3.4 g/dL — ABNORMAL LOW (ref 3.5–5.0)
BUN: 8 mg/dL (ref 6–20)
CALCIUM: 9.2 mg/dL (ref 8.9–10.3)
CHLORIDE: 106 mmol/L (ref 101–111)
CO2: 21 mmol/L — AB (ref 22–32)
Creatinine, Ser: 0.4 mg/dL — ABNORMAL LOW (ref 0.44–1.00)
GFR calc non Af Amer: >60 mL/min (ref >60)
Glucose, Bld: 77 mg/dL (ref 65–99)
Potassium: 3.3 mmol/L — ABNORMAL LOW (ref 3.5–5.1)
SODIUM: 136 mmol/L (ref 135–145)
Total Bilirubin: 0.2 mg/dL — ABNORMAL LOW (ref 0.3–1.2)
Total Protein: 6.7 g/dL (ref 6.5–8.1)

## 2016-02-07 LAB — URINE MICROSCOPIC-ADD ON

## 2016-02-07 LAB — DIFFERENTIAL
BASOS PCT: 0 %
Basophils Absolute: 0 10*3/uL (ref 0.0–0.1)
EOS PCT: 2 %
Eosinophils Absolute: 0.2 10*3/uL (ref 0.0–0.7)
Lymphocytes Relative: 27 %
Lymphs Abs: 2.5 10*3/uL (ref 0.7–4.0)
MONO ABS: 0.3 10*3/uL (ref 0.1–1.0)
Monocytes Relative: 3 %
Neutro Abs: 6.4 10*3/uL (ref 1.7–7.7)
Neutrophils Relative %: 68 %

## 2016-02-07 LAB — PROTEIN / CREATININE RATIO, URINE
Creatinine, Urine: 65 mg/dL
PROTEIN CREATININE RATIO: 0.11 mg/mg{creat} (ref 0.00–0.15)
TOTAL PROTEIN, URINE: 7 mg/dL

## 2016-02-07 LAB — URINALYSIS, ROUTINE W REFLEX MICROSCOPIC
BILIRUBIN URINE: NEGATIVE
Glucose, UA: 100 mg/dL — AB
HGB URINE DIPSTICK: NEGATIVE
KETONES UR: 40 mg/dL — AB
NITRITE: NEGATIVE
PH: 6 (ref 5.0–8.0)
Protein, ur: NEGATIVE mg/dL
Specific Gravity, Urine: 1.015 (ref 1.005–1.030)

## 2016-02-07 LAB — HEPATITIS B SURFACE ANTIGEN: HEP B S AG: NEGATIVE

## 2016-02-07 LAB — GLUCOSE TOLERANCE, 1 HOUR: Glucose, 1 Hour GTT: 146 mg/dL — ABNORMAL HIGH (ref 70–140)

## 2016-02-07 MED ORDER — RHO D IMMUNE GLOBULIN 1500 UNIT/2ML IJ SOSY
300.0000 ug | PREFILLED_SYRINGE | Freq: Once | INTRAMUSCULAR | Status: AC
  Administered 2016-02-07: 300 ug via INTRAMUSCULAR
  Filled 2016-02-07: qty 2

## 2016-02-07 NOTE — MAU Provider Note (Signed)
Chief Complaint:  Decreased Fetal Movement   First Provider Initiated Contact with Patient 02/07/16 1754     HPI: Caitlyn [name] is a 25 y.o. G4P0030 at [id number] by 12 week US in [place] who presents to maternity admissions reporting decreased fetal mvmt. Came back to US 2 days ago. Was getting prenatal care in [place]. Does not have records with her. Only has US record at home. Denies complications with this pregnancy. States she had diabetes test at 3 months that was Nml. States she did not get shot for negative blood type during this pregnancy. Recalls receiving it after SAB.   Associated signs and symptoms: Neg for contractions, leakage of fluid or vaginal bleeding.    Past Medical History:  Diagnosis Date  . Anemia   . Medical history non-contributory    OB History  Gravida Para Term Preterm AB Living  4       3    SAB TAB Ectopic Multiple Live Births  2   1        # Outcome Date GA Lbr Len/2nd Weight Sex Delivery Anes PTL Lv  4 Current           3 Ectopic 2016          2 SAB           1 SAB              Past Surgical History:  Procedure Laterality Date  . NO PAST SURGERIES     No family history on file. Social History  Substance Use Topics  . Smoking status: Never Smoker  . Smokeless tobacco: Never Used  . Alcohol use No   No Known Allergies No prescriptions prior to admission.    I have reviewed patient's Past Medical Hx, Surgical Hx, Family Hx, Social Hx, medications and allergies.   ROS:  Review of Systems  Constitutional: Negative for chills and fever.  Eyes: Negative for visual disturbance.  Gastrointestinal: Negative for abdominal pain.  Genitourinary: Negative for vaginal bleeding and vaginal discharge.  Neurological: Negative for headaches.    Physical Exam  Patient Vitals for the past 24 hrs:  BP Temp Temp src Pulse Resp  02/07/16 2021 128/71 - - (!) 128 18  02/07/16 1808 125/73 - - 102 -  02/07/16 1711 125/79 - - 118 -  02/07/16 1704 129/85 - - 105 -   02/07/16 1653 145/85 98.8 F (37.1 C) Oral 114 18   Constitutional: Well-developed, well-nourished female in no acute distress.  Cardiovascular: normal rate Respiratory: normal effort GI: Abd soft, non-tender, gravid appropriate for gestational age.  MS: Extremities nontender, no edema, normal ROM Neurologic: Alert and oriented x 4.  GU: Deferred    FHT:  Baseline 150 , moderate variability, accelerations present, no decelerations Contractions: UI   Labs: Results for orders placed or performed during the hospital encounter of 02/07/16 (from the past 24 hour(s))  CBC     Status: Abnormal   Collection Time: 02/07/16  6:15 PM  Result Value Ref Range   WBC 9.5 4.0 - 10.5 K/uL   RBC 3.42 (L) 3.87 - 5.11 MIL/uL   Hemoglobin 10.8 (L) 12.0 - 15.0 g/dL   HCT 16.130.9 (L) 09.636.0 - 04.546.0 %   MCV 90.4 78.0 - 100.0 fL   MCH 31.6 26.0 - 34.0 pg   MCHC 35.0 30.0 - 36.0 g/dL   RDW 40.913.8 81.111.5 - 91.415.5 %   Platelets 203 150 - 400 K/uL  Differential  Status: None   Collection Time: 02/07/16  6:15 PM  Result Value Ref Range   Neutrophils Relative % 68 %   Neutro Abs 6.4 1.7 - 7.7 K/uL   Lymphocytes Relative 27 %   Lymphs Abs 2.5 0.7 - 4.0 K/uL   Monocytes Relative 3 %   Monocytes Absolute 0.3 0.1 - 1.0 K/uL   Eosinophils Relative 2 %   Eosinophils Absolute 0.2 0.0 - 0.7 K/uL   Basophils Relative 0 %   Basophils Absolute 0.0 0.0 - 0.1 K/uL  Comprehensive metabolic panel     Status: Abnormal   Collection Time: 02/07/16  6:15 PM  Result Value Ref Range   Sodium 136 135 - 145 mmol/L   Potassium 3.3 (L) 3.5 - 5.1 mmol/L   Chloride 106 101 - 111 mmol/L   CO2 21 (L) 22 - 32 mmol/L   Glucose, Bld 77 65 - 99 mg/dL   BUN 8 6 - 20 mg/dL   Creatinine, Ser 9.600.40 (L) 0.44 - 1.00 mg/dL   Calcium 9.2 8.9 - 45.410.3 mg/dL   Total Protein 6.7 6.5 - 8.1 g/dL   Albumin 3.4 (L) 3.5 - 5.0 g/dL   AST 15 15 - 41 U/L   ALT 13 (L) 14 - 54 U/L   Alkaline Phosphatase 95 38 - 126 U/L   Total Bilirubin 0.2 (L) 0.3 -  1.2 mg/dL   GFR calc non Af Amer >60 >60 mL/min   GFR calc Af Amer >60 >60 mL/min   Anion gap 9 5 - 15  Rh IG workup (includes ABO/Rh)     Status: None (Preliminary result)   Collection Time: 02/07/16  6:15 PM  Result Value Ref Range   Gestational Age(Wks) 22.3    ABO/RH(D) O NEG    Antibody Screen NEG    Fetal Screen NEG    Unit Number [id number]    Blood Component Type RHIG    Unit division 00    Status of Unit ISSUED    Transfusion Status OK TO TRANSFUSE   Glucose tolerance, 1 hour     Status: Abnormal   Collection Time: 02/07/16  7:37 PM  Result Value Ref Range   Glucose, 1 Hour GTT 146 (H) 70 - 140 mg/dL  Protein / creatinine ratio, urine     Status: None   Collection Time: 02/07/16  8:06 PM  Result Value Ref Range   Creatinine, Urine 65.00 mg/dL   Total Protein, Urine 7 mg/dL   Protein Creatinine Ratio 0.11 0.00 - 0.15 mg/mg[Cre]  Urinalysis, Routine w reflex microscopic (not at [facility])     Status: Abnormal   Collection Time: 02/07/16  8:06 PM  Result Value Ref Range   Color, Urine YELLOW YELLOW   APPearance CLEAR CLEAR   Specific Gravity, Urine 1.015 1.005 - 1.030   pH 6.0 5.0 - 8.0   Glucose, UA 100 (A) NEGATIVE mg/dL   Hgb urine dipstick NEGATIVE NEGATIVE   Bilirubin Urine NEGATIVE NEGATIVE   Ketones, ur 40 (A) NEGATIVE mg/dL   Protein, ur NEGATIVE NEGATIVE mg/dL   Nitrite NEGATIVE NEGATIVE   Leukocytes, UA MODERATE (A) NEGATIVE  Urine microscopic-add on     Status: Abnormal   Collection Time: 02/07/16  8:06 PM  Result Value Ref Range   Squamous Epithelial / LPF 6-30 (A) NONE SEEN   WBC, UA 6-30 0 - 5 WBC/hpf   RBC / HPF 0-5 0 - 5 RBC/hpf   Bacteria, UA FEW (A) NONE SEEN  Imaging:  No results found.  MAU Course: Orders Placed This Encounter  Procedures  . Culture, OB Urine  . Hepatitis B surface antigen  . Rubella screen  . RPR  . CBC  . Differential  . Comprehensive metabolic panel  . Protein / creatinine ratio, urine  . HIV antibody  (routine testing)  . Urinalysis, Routine w reflex microscopic (not at [facility])  . Glucose tolerance, 1 hour  . Urine microscopic-add on  . Vital signs  . Rh IG workup (includes ABO/Rh)  . Discharge patient   Meds ordered this encounter  Medications  . Prenatal Vit-Fe Fumarate-FA (PRENATAL MULTIVITAMIN) TABS tablet    Sig: Take 1 tablet by mouth daily at 12 noon.  . rho (d) immune globulin (RHIG/RHOPHYLAC) injection 300 mcg    MDM: - Decreased FM, resolved. Reactive NST. - Limited PNC, none in [place]. Prenatal panel, 1 hour GTT drawn. Rhophylac given. In-basket message sent to [facility] to schedule NOB ASAP.  - One elevated BP. Denies Hx HTN. No Pre-E. Labs Nml. No evidence of Pre-E. CTO closely. - Elevated 1 hour GTT resulted after pt left. Will need 3 hour GTT ASAP.   Assessment: 1. Decreased fetal movements in third trimester, single or unspecified fetus   2. Limited prenatal care in third trimester   3. Rh negative state in antepartum period, third trimester   4. Transient hypertension of pregnancy, third trimester     Plan: Discharge home in stable condition.  Preterm labor precautions and fetal kick counts Pre-E precautions.  Follow-up Information    [facility] Follow up.   Specialty:  Obstetrics and Gynecology Why:  Will call you to scheduled new prenatal appointment. Contact information: [address] [place] [address] [phone]       [facility]ERN[facility] ADMISS[facility] Follow up.   Why:  as needed in emergencies Contact information: [address] [id number] mc [place] [address] [phone]            Medication List    STOP taking these medications   oxyCODONE-acetaminophen 5-325 MG tablet Commonly known as:  PERCOCET     TAKE these medications   prenatal multivitamin Tabs tablet Take 1 tablet by mouth daily at 12 noon.       [place], CNM 02/07/2016 9:11  PM

## 2016-02-07 NOTE — MAU Note (Signed)
Pt reports the baby has not been moving much as usual today. Has gotten most of her care in [place]. Normal pregnancy according to patient. Prenatal records at home. Denies any pain or cramping or discharge at this time.

## 2016-02-07 NOTE — Discharge Instructions (Signed)
Third Trimester of Pregnancy °The third trimester is from week 29 through week 42, months 7 through 9. This trimester is when your unborn baby (fetus) is growing very fast. At the end of the ninth month, the unborn baby is about 20 inches in length. It weighs about 6-10 pounds. °Follow these instructions at home: °· Avoid all smoking, herbs, and alcohol. Avoid drugs not approved by your doctor. °· Do not use any tobacco products, including cigarettes, chewing tobacco, and electronic cigarettes. If you need help quitting, ask your doctor. You may get counseling or other support to help you quit. °· Only take medicine as told by your doctor. Some medicines are safe and some are not during pregnancy. °· Exercise only as told by your doctor. Stop exercising if you start having cramps. °· Eat regular, healthy meals. °· Wear a good support bra if your breasts are tender. °· Do not use hot tubs, steam rooms, or saunas. °· Wear your seat belt when driving. °· Avoid raw meat, uncooked cheese, and liter boxes and soil used by cats. °· Take your prenatal vitamins. °· Take 1500-2000 milligrams of calcium daily starting at the 20th week of pregnancy until you deliver your baby. °· Try taking medicine that helps you poop (stool softener) as needed, and if your doctor approves. Eat more fiber by eating fresh fruit, vegetables, and whole grains. Drink enough fluids to keep your pee (urine) clear or pale yellow. °· Take warm water baths (sitz baths) to soothe pain or discomfort caused by hemorrhoids. Use hemorrhoid cream if your doctor approves. °· If you have puffy, bulging veins (varicose veins), wear support hose. Raise (elevate) your feet for 15 minutes, 3-4 times a day. Limit salt in your diet. °· Avoid heavy lifting, wear low heels, and sit up straight. °· Rest with your legs raised if you have leg cramps or low back pain. °· Visit your dentist if you have not gone during your pregnancy. Use a soft toothbrush to brush your  teeth. Be gentle when you floss. °· You can have sex (intercourse) unless your doctor tells you not to. °· Do not travel far distances unless you must. Only do so with your doctor's approval. °· Take prenatal classes. °· Practice driving to the hospital. °· Pack your hospital bag. °· Prepare the baby's room. °· Go to your doctor visits. °Get help if: °· You are not sure if you are in labor or if your water has broken. °· You are dizzy. °· You have mild cramps or pressure in your lower belly (abdominal). °· You have a nagging pain in your belly area. °· You continue to feel sick to your stomach (nauseous), throw up (vomit), or have watery poop (diarrhea). °· You have bad smelling fluid coming from your vagina. °· You have pain with peeing (urination). °Get help right away if: °· You have a fever. °· You are leaking fluid from your vagina. °· You are spotting or bleeding from your vagina. °· You have severe belly cramping or pain. °· You lose or gain weight rapidly. °· You have trouble catching your breath and have chest pain. °· You notice sudden or extreme puffiness (swelling) of your face, hands, ankles, feet, or legs. °· You have not felt the baby move in over an hour. °· You have severe headaches that do not go away with medicine. °· You have vision changes. °This information is not intended to replace advice given to you by your health care provider. Make   sure you discuss any questions you have with your health care provider. Document Released: 05/20/2009 Document Revised: 08/01/2015 Document Reviewed: 04/26/2012 Elsevier Interactive Patient Education  2017 Elsevier Inc.   Hypertension During Pregnancy Hypertension is also called high blood pressure. High blood pressure means that the force of your blood moving in your body is too strong. When you are pregnant, this condition should be watched carefully. It can cause problems for you and your baby. Follow these instructions at home: Eating and  drinking  Drink enough fluid to keep your pee (urine) clear or pale yellow.  Eat healthy foods that are low in salt (sodium).  Do not add salt to your food.  Check labels on foods and drinks to see much salt is in them. Look on the label where you see "Sodium." Lifestyle  Do not use any products that contain nicotine or tobacco, such as cigarettes and e-cigarettes. If you need help quitting, ask your doctor.  Do not use alcohol.  Avoid caffeine.  Avoid stress. Rest and get plenty of sleep. General instructions  Take over-the-counter and prescription medicines only as told by your doctor.  While lying down, lie on your left side. This keeps pressure off your baby.  While sitting or lying down, raise (elevate) your feet. Try putting some pillows under your lower legs.  Exercise regularly. Ask your doctor what kinds of exercise are best for you.  Keep all prenatal and follow-up visits as told by your doctor. This is important. Contact a doctor if:  You have symptoms that your doctor told you to watch for, such as:  Fever.  Throwing up (vomiting).  Headache. Get help right away if:  You have very bad pain in your belly (abdomen).  You are throwing up, and this does not get better with treatment.  You suddenly get swelling in your hands, ankles, or face.  You gain 4 lb (1.8 kg) or more in 1 week.  You get bleeding from your vagina.  You have blood in your pee.  You do not feel your baby moving as much as normal.  You have a change in vision.  You have muscle twitching or sudden tightening (spasms).  You have trouble breathing.  Your lips or fingernails turn blue. This information is not intended to replace advice given to you by your health care provider. Make sure you discuss any questions you have with your health care provider. Document Released: 03/28/2010 Document Revised: 11/05/2015 Document Reviewed: 11/05/2015 Elsevier Interactive Patient Education   2017 [employer]

## 2016-02-08 LAB — RH IG WORKUP (INCLUDES ABO/RH)
ABO/RH(D): O NEG
Antibody Screen: NEGATIVE
Fetal Screen: NEGATIVE
GESTATIONAL AGE(WKS): 22.3
UNIT DIVISION: 0

## 2016-02-08 LAB — RPR: RPR Ser Ql: NONREACTIVE

## 2016-02-08 LAB — RUBELLA SCREEN: Rubella: 3.81 index (ref >0.99)

## 2016-02-08 LAB — HIV ANTIBODY (ROUTINE TESTING W REFLEX): HIV Screen 4th Generation wRfx: NONREACTIVE

## 2016-02-10 LAB — CULTURE, OB URINE: SPECIAL REQUESTS: NORMAL

## 2016-02-11 ENCOUNTER — Telehealth: Payer: Self-pay | Admitting: Certified Nurse Midwife

## 2016-02-11 MED ORDER — SULFAMETHOXAZOLE-TRIMETHOPRIM 800-160 MG PO TABS: 1.0000 | ORAL_TABLET | Freq: Two times a day (BID) | ORAL | 0 refills | Status: DC

## 2016-02-11 NOTE — Telephone Encounter (Signed)
Pt unavailable and does not speak [place]. Notified spouse of +UTI and medication sent to pharmacy. Instructed to increase water intake. Verbalized understanding.

## 2016-02-12 ENCOUNTER — Other Ambulatory Visit: Payer: Self-pay | Admitting: Advanced Practice Midwife

## 2016-02-12 DIAGNOSIS — O2343 Unspecified infection of urinary tract in pregnancy, third trimester: Secondary | ICD-10-CM

## 2016-02-12 MED ORDER — AMPICILLIN 500 MG PO CAPS: 500.0000 mg | ORAL_CAPSULE | Freq: Four times a day (QID) | ORAL | 0 refills | Status: DC

## 2016-02-13 ENCOUNTER — Telehealth: Payer: Self-pay

## 2016-02-13 NOTE — Telephone Encounter (Signed)
Please notify pt of Dx UTI, Rx Ampicillin. Called patient using pacific interpreter no answer or voicemail to leave a message.

## 2016-02-14 ENCOUNTER — Telehealth: Payer: Self-pay | Admitting: General Practice

## 2016-02-14 NOTE — Telephone Encounter (Signed)
Per Caitlyn KinsmanVirginia [name], patient needs new OB appt & 3 hr gtt. Scheduled for 12/11 @ 1020. Patient to arrive at 8am. Called patient with pacific interpreter [phone], no answer-left detailed message with appt and fasting guidelines for 3 hr.

## 2016-02-17 ENCOUNTER — Ambulatory Visit (INDEPENDENT_AMBULATORY_CARE_PROVIDER_SITE_OTHER): Payer: Medicaid Other | Admitting: Obstetrics and Gynecology

## 2016-02-17 ENCOUNTER — Encounter: Payer: Self-pay | Admitting: Obstetrics and Gynecology

## 2016-02-17 VITALS — BP 124/74 | HR 104 | Wt 151.3 lb

## 2016-02-17 DIAGNOSIS — O133 Gestational [pregnancy-induced] hypertension without significant proteinuria, third trimester: Secondary | ICD-10-CM

## 2016-02-17 DIAGNOSIS — Z113 Encounter for screening for infections with a predominantly sexual mode of transmission: Secondary | ICD-10-CM

## 2016-02-17 DIAGNOSIS — R7302 Impaired glucose tolerance (oral): Secondary | ICD-10-CM

## 2016-02-17 DIAGNOSIS — Z789 Other specified health status: Secondary | ICD-10-CM

## 2016-02-17 DIAGNOSIS — O99810 Abnormal glucose complicating pregnancy: Secondary | ICD-10-CM | POA: Insufficient documentation

## 2016-02-17 DIAGNOSIS — O2340 Unspecified infection of urinary tract in pregnancy, unspecified trimester: Secondary | ICD-10-CM | POA: Insufficient documentation

## 2016-02-17 DIAGNOSIS — O2343 Unspecified infection of urinary tract in pregnancy, third trimester: Secondary | ICD-10-CM

## 2016-02-17 DIAGNOSIS — Z124 Encounter for screening for malignant neoplasm of cervix: Secondary | ICD-10-CM

## 2016-02-17 DIAGNOSIS — O0933 Supervision of pregnancy with insufficient antenatal care, third trimester: Secondary | ICD-10-CM

## 2016-02-17 DIAGNOSIS — R7309 Other abnormal glucose: Secondary | ICD-10-CM

## 2016-02-17 LAB — POCT URINALYSIS DIP (DEVICE)
Bilirubin Urine: NEGATIVE
Glucose, UA: NEGATIVE mg/dL
Ketones, ur: NEGATIVE mg/dL
NITRITE: NEGATIVE
PH: 6 (ref 5.0–8.0)
Protein, ur: NEGATIVE mg/dL
SPECIFIC GRAVITY, URINE: 1.015 (ref 1.005–1.030)
UROBILINOGEN UA: 0.2 mg/dL (ref 0.0–1.0)

## 2016-02-17 LAB — OB RESULTS CONSOLE GBS: GBS: NEGATIVE

## 2016-02-17 MED ORDER — SULFAMETHOXAZOLE-TRIMETHOPRIM 800-160 MG PO TABS: 1.0000 | ORAL_TABLET | Freq: Two times a day (BID) | ORAL | 0 refills | Status: DC

## 2016-02-17 MED ORDER — FOSFOMYCIN TROMETHAMINE 3 G PO PACK: 3.0000 g | PACK | Freq: Once | ORAL | 0 refills | Status: AC

## 2016-02-17 NOTE — Progress Notes (Signed)
New OB Note  02/17/2016   Clinic: [facility]  Chief Complaint: NOB  Transfer of Care Patient: yes  History of Present Illness: Ms. Caitlyn [name] is a 25 y.o. G4P0030 @ 37/0 weeks (EDC 1/1, based on pt endorsed u/s results.  Patient's last menstrual period was 05/07/2015.) Preg complicated by has Encounter for supervision of other normal pregnancy; Limited prenatal care in third trimester; Transient hypertension of pregnancy; Rh negative state in antepartum period, third trimester; UTI in pregnancy; and Abnormal glucose affecting pregnancy on her problem list.   Her periods were: not regular after last miscarriage.  She was using no method when she conceived.  She has Negative signs or symptoms of nausea/vomiting of pregnancy. She has Negative signs or symptoms of labor  Patient states her last visit for OB care in [place] was about 20 days ago. She denies any issues or problems with her pregnancy.   ROS: A 12-point review of systems was performed and negative, except as stated in the above HPI.  OBGYN History: As per HPI. OB History  Gravida Para Term Preterm AB Living  4       3    SAB TAB Ectopic Multiple Live Births  2   1        # Outcome Date GA Lbr Len/2nd Weight Sex Delivery Anes PTL Lv  4 Current           3 Ectopic 2016          2 SAB           1 SAB                Past Medical History: Past Medical History:  Diagnosis Date  . Anemia   . Medical history non-contributory     Past Surgical History: Past Surgical History:  Procedure Laterality Date  . NO PAST SURGERIES      Family History:  History reviewed. No pertinent family history. She denies any history of mental retardation, birth defects or genetic disorders in her or the FOB's history  Social History:  Social History   Social History  . Marital status: Married    Spouse name: N/A  . Number of children: N/A  . Years of education: N/A   Occupational History  . Not on file.   Social  History Main Topics  . Smoking status: Never Smoker  . Smokeless tobacco: Never Used  . Alcohol use No  . Drug use: No  . Sexual activity: Yes    Birth control/ protection: None     Comment: last intercourse Jan 13 2015   Other Topics Concern  . Not on file   Social History Narrative  . No narrative on file    Allergy: No Known Allergies  Health Maintenance:  Mammogram Up to Date: not applicable  Current Outpatient Medications: PNV  Physical Exam:   BP 124/74   Pulse (!) 104   Wt 151 lb 4.8 oz (68.6 kg)   LMP 05/07/2015   BMI 27.67 kg/m  Body mass index is 27.67 kg/m. Fundal height: 36 FHTs: 137  General appearance: Well nourished, well developed female in no acute distress.  Cardiovascular: normal in MAU 12/1 Respiratory:  Normal respiratory effort Abdomen: gravid, nttp Neuro/Psych:  Normal mood and affect.  Skin:  Warm and dry.  Lymphatic:  No inguinal lymphadenopathy.  No CVAT  Pelvic exam: is not limited by body habitus EGBUS: within normal limits, Vagina: within normal limits and with no  blood in the vault, Cervix: normal appearing cervix without discharge or lesions, and visually closed Uterus:  NTTP, and Adnexa:  normal adnexa and no mass, fullness, tenderness  Laboratory: 12/1: PC ratio, HIV, cmp, CBC, rpr, hiv, hepB surf ag, rubella O neg and no antibodies  +UCx and 1hr GCT  Imaging:  none  Assessment: pt doing well  Plan: 1. Abnormal glucose tolerance test Pt getting 3hr today - Glucose tolerance, 3 hours  2. Limited prenatal care in third trimester Pt told to bring u/s results (states she had one at around 12 weeks and 20-24wks in [place]) to confirm dates. MFM screening u/s ordered and pap smear, GBS today - US MFM OB COMP + 14 WK; Future - Cytology - PAP - Culture, beta strep (group b only)  3. Transient hypertension of pregnancy in third trimester None today  4. Urinary tract infection in mother during third trimester of  pregnancy Pt not aware of abx sent in (bactrim and amp). Will do fosfomycin as will try to avoid bactrim in the third trimester  5. Rh neg S/p rhogam in MAU 12/1  Interpreter used.   Problem list reviewed and updated.  Follow up in 1 weeks.  >50% of 20 min visit spent on counseling and coordination of care.     Cornelia Copaharlie Mahayla Haddaway, Jr. MD Attending [facility](Faculty Practice)

## 2016-02-17 NOTE — Progress Notes (Signed)
Desire flu shot today

## 2016-02-18 LAB — GLUCOSE TOLERANCE, 3 HOURS
GLUCOSE, 1 HOUR-GESTATIONAL: 115 mg/dL (ref <190)
GLUCOSE, 2 HOUR-GESTATIONAL: 108 mg/dL (ref <165)
GLUCOSE, FASTING-GESTATIONAL: 73 mg/dL (ref 65–104)
Glucose, GTT - 3 Hour: 114 mg/dL (ref <145)

## 2016-02-19 LAB — CYTOLOGY - PAP
CHLAMYDIA, DNA PROBE: NEGATIVE
Diagnosis: NEGATIVE
NEISSERIA GONORRHEA: NEGATIVE

## 2016-02-20 LAB — CULTURE, BETA STREP (GROUP B ONLY)

## 2016-02-24 ENCOUNTER — Ambulatory Visit (HOSPITAL_COMMUNITY): Payer: Self-pay | Attending: Obstetrics and Gynecology

## 2016-02-27 ENCOUNTER — Ambulatory Visit (INDEPENDENT_AMBULATORY_CARE_PROVIDER_SITE_OTHER): Payer: Self-pay | Admitting: Obstetrics & Gynecology

## 2016-02-27 VITALS — BP 126/67 | HR 94 | Wt 152.2 lb

## 2016-02-27 DIAGNOSIS — O0933 Supervision of pregnancy with insufficient antenatal care, third trimester: Secondary | ICD-10-CM

## 2016-02-27 DIAGNOSIS — Z3483 Encounter for supervision of other normal pregnancy, third trimester: Secondary | ICD-10-CM

## 2016-02-27 NOTE — Progress Notes (Signed)
Video Interpreter # [id number]

## 2016-02-27 NOTE — Progress Notes (Signed)
   PRENATAL VISIT NOTE  Subjective:  Caitlyn [name] is a 25 y.o. G4P0030 at 731w3d being seen today for ongoing prenatal care.  She is currently monitored for the following issues for this low-risk pregnancy and has Encounter for supervision of other normal pregnancy; Limited prenatal care in third trimester; Transient hypertension of pregnancy; Rh negative state in antepartum period, third trimester; UTI in pregnancy; Abnormal glucose affecting pregnancy; and Language barrier on her problem list.  Patient reports no complaints.  Contractions: Not present. Vag. Bleeding: None.  Movement: Present. Denies leaking of fluid.   The following portions of the patient's history were reviewed and updated as appropriate: allergies, current medications, past family history, past medical history, past social history, past surgical history and problem list. Problem list updated.  Objective:   Vitals:   02/27/16 1335  BP: 126/67  Pulse: 94  Weight: 152 lb 3.2 oz (69 kg)    Fetal Status: Fetal Heart Rate (bpm): 157   Movement: Present     General:  Alert, oriented and cooperative. Patient is in no acute distress.  Skin: Skin is warm and dry. No rash noted.   Cardiovascular: Normal heart rate noted  Respiratory: Normal respiratory effort, no problems with respiration noted  Abdomen: Soft, gravid, appropriate for gestational age. Pain/Pressure: Absent     Pelvic:  Cervical exam deferred        Extremities: Normal range of motion.  Edema: None  Mental Status: Normal mood and affect. Normal behavior. Normal judgment and thought content.   Assessment and Plan:  Pregnancy: G4P0030 at 691w3d  1. Encounter for supervision of other normal pregnancy in third trimester Doing well  2. Limited prenatal care in third trimester Had care in [place]  Term labor symptoms and general obstetric precautions including but not limited to vaginal bleeding, contractions, leaking of fluid and fetal movement were reviewed in  detail with the patient. Please refer to After Visit Summary for other counseling recommendations.  Return in about 1 week (around 03/05/2016).   Adam PhenixJames G Jerilee Space, MD

## 2016-03-04 ENCOUNTER — Encounter: Payer: Self-pay | Admitting: Obstetrics & Gynecology

## 2016-03-05 ENCOUNTER — Ambulatory Visit (INDEPENDENT_AMBULATORY_CARE_PROVIDER_SITE_OTHER): Payer: Self-pay | Admitting: Obstetrics and Gynecology

## 2016-03-05 VITALS — BP 117/69 | HR 90 | Wt 154.0 lb

## 2016-03-05 DIAGNOSIS — Z3483 Encounter for supervision of other normal pregnancy, third trimester: Secondary | ICD-10-CM

## 2016-03-05 NOTE — Progress Notes (Signed)
Arabic video interpreter "Asmaa" [phone]#[id number]

## 2016-03-05 NOTE — Progress Notes (Signed)
Prenatal Visit Note Date: 03/05/2016 [facility]  Subjective:  Caitlyn [name] is a 25 y.o. G4P0030 at 3966w2d being seen today for ongoing prenatal care.  She is currently monitored for the following issues for this low-risk pregnancy and has Encounter for supervision of other normal pregnancy; Limited prenatal care in third trimester; Transient hypertension of pregnancy; Rh negative state in antepartum period, third trimester; UTI in pregnancy; Abnormal glucose affecting pregnancy; and Language barrier on her problem list.  Patient reports no complaints.   Contractions: Not present. Vag. Bleeding: None.  Movement: Present. Denies leaking of fluid.   The following portions of the patient's history were reviewed and updated as appropriate: allergies, current medications, past family history, past medical history, past social history, past surgical history and problem list. Problem list updated.  Objective:   Vitals:   03/05/16 1248  BP: 117/69  Pulse: 90  Weight: 154 lb (69.9 kg)    Fetal Status: Fetal Heart Rate (bpm): 140 Fundal Height: 38 cm Movement: Present  Presentation: Vertex  General:  Alert, oriented and cooperative. Patient is in no acute distress.  Skin: Skin is warm and dry. No rash noted.   Cardiovascular: Normal heart rate noted  Respiratory: Normal respiratory effort, no problems with respiration noted  Abdomen: Soft, gravid, appropriate for gestational age. Pain/Pressure: Present     Pelvic:  Cervical exam deferred        Extremities: Normal range of motion.  Edema: None  Mental Status: Normal mood and affect. Normal behavior. Normal judgment and thought content.   Urinalysis:      Assessment and Plan:  Pregnancy: G4P0030 at 1966w2d  *Pregnancy: the patient states that she brought her records last week but wasn't asked at that visit to show them to anyone. She unfortunately doesn't have them here today. I asked her again today and she said  that her EDC is definitely 1/16 and her first ever u/s was done at 12wks, which is different EDC given at her NOB transfer visit which was 1/1. Her dates were changed to make her 37wks from 6939wks. I told her to please bring the records next visit. IOL for past 41wks d/w pt. Patient also no showed to 12/18 mfm u/s anatomy scan appt. If patient doesn't bring u/s records to next visit, can consider reordering mfm scan  *UTI: TOC nv.   Interpreter used.   Term labor symptoms and general obstetric precautions including but not limited to vaginal bleeding, contractions, leaking of fluid and fetal movement were reviewed in detail with the patient. Please refer to After Visit Summary for other counseling recommendations.  Return in about 1 week (around 03/12/2016) for rob.   Florence Bingharlie Lun Muro, MD

## 2016-03-09 NOTE — L&D Delivery Note (Signed)
Delivery Note At 6:08 PM a viable female was delivered via Vaginal, Spontaneous Delivery (Presentation OA to ROA ).  APGAR: 8, 9; weight 6 lb 0.1 oz (2725 g).  Infant immediately placed on maternal abdomen; dried and stimulated.  Placenta status: delivered intact with gentle traction.  Cord:  with the following complications: None.    Anesthesia:  epidural Episiotomy: None Lacerations: 2nd degree perineal; right labia 1st degreel Suture Repair: 3.0 vicryl and 2.0 monocryl Est. Blood Loss (mL):  400  Mom to postpartum.  Baby to Couplet care / Skin to Skin.  Charlesetta GaribaldiKathryn Lorraine Kooistra CNM 03/14/2016, 7:51 PM

## 2016-03-12 ENCOUNTER — Ambulatory Visit (INDEPENDENT_AMBULATORY_CARE_PROVIDER_SITE_OTHER): Payer: Self-pay | Admitting: Student

## 2016-03-12 ENCOUNTER — Telehealth: Payer: Self-pay | Admitting: General Practice

## 2016-03-12 ENCOUNTER — Encounter: Payer: Self-pay | Admitting: Student

## 2016-03-12 VITALS — BP 119/79 | HR 111 | Wt 157.6 lb

## 2016-03-12 DIAGNOSIS — Z789 Other specified health status: Secondary | ICD-10-CM

## 2016-03-12 DIAGNOSIS — O368130 Decreased fetal movements, third trimester, not applicable or unspecified: Secondary | ICD-10-CM

## 2016-03-12 DIAGNOSIS — Z3483 Encounter for supervision of other normal pregnancy, third trimester: Secondary | ICD-10-CM

## 2016-03-12 DIAGNOSIS — O2343 Unspecified infection of urinary tract in pregnancy, third trimester: Secondary | ICD-10-CM

## 2016-03-12 NOTE — Progress Notes (Signed)
[employer] video interpreter Saif # [id number] used for encounter.

## 2016-03-12 NOTE — Patient Instructions (Addendum)
Introduction Patient Name: ________________________________________________ Patient Due Date: ____________________ What is a fetal movement count? A fetal movement count is the number of times that you feel your baby move during a certain amount of time. This may also be called a fetal kick count. A fetal movement count is recommended for every pregnant woman. You may be asked to start counting fetal movements as early as week 28 of your pregnancy. Pay attention to when your baby is most active. You may notice your baby's sleep and wake cycles. You may also notice things that make your baby move more. You should do a fetal movement count:  When your baby is normally most active.  At the same time each day. A good time to count movements is while you are resting, after having something to eat and drink. How do I count fetal movements? 1. Find a quiet, comfortable area. Sit, or lie down on your side. 2. Write down the date, the start time and stop time, and the number of movements that you felt between those two times. Take this information with you to your health care visits. 3. For 2 hours, count kicks, flutters, swishes, rolls, and jabs. You should feel at least 10 movements during 2 hours. 4. You may stop counting after you have felt 10 movements. 5. If you do not feel 10 movements in 2 hours, have something to eat and drink. Then, keep resting and counting for 1 hour. If you feel at least 4 movements during that hour, you may stop counting. Contact a health care provider if:  You feel fewer than 4 movements in 2 hours.  Your baby is not moving like he or she usually does. Date: ____________ Start time: ____________ Stop time: ____________ Movements: ____________ Date: ____________ Start time: ____________ Stop time: ____________ Movements: ____________ Date: ____________ Start time: ____________ Stop time: ____________ Movements: ____________ Date: ____________ Start time: ____________  Stop time: ____________ Movements: ____________ Date: ____________ Start time: ____________ Stop time: ____________ Movements: ____________ Date: ____________ Start time: ____________ Stop time: ____________ Movements: ____________ Date: ____________ Start time: ____________ Stop time: ____________ Movements: ____________ Date: ____________ Start time: ____________ Stop time: ____________ Movements: ____________ Date: ____________ Start time: ____________ Stop time: ____________ Movements: ____________ This information is not intended to replace advice given to you by your health care provider. Make sure you discuss any questions you have with your health care provider. Document Released: 03/25/2006 Document Revised: 10/23/2015 Document Reviewed: 04/04/2015 Elsevier Interactive Patient Education  2017 [employer]     Contraception Choices Birth control (contraception) is the use of any methods or devices to stop pregnancy from happening. Below are some methods to help avoid pregnancy. Hormonal birth control  A small tube put under the skin of the upper arm (implant). The tube can stay in place for 3 years. The implant must be taken out after 3 years.  Shots given every 3 months.  Pills taken every day.  Patches that are changed once a week.  A ring put into the vagina (vaginal ring). The ring is left in place for 3 weeks and removed for 1 week. Then, a new ring is put in the vagina.  Emergency birth control pills taken after unprotected sex (intercourse). Barrier birth control  A thin covering worn on the penis (female condom) during sex.  A soft, loose covering put into the vagina (female condom) before sex.  A rubber bowl that sits over the cervix (diaphragm). The bowl must be made for you. The bowl  is put into the vagina before sex. The bowl is left in place for 6 to 8 hours after sex.  A small, soft cup that fits over the cervix (cervical cap). The cup must be made for you.  The cup can be left in place for 48 hours after sex.  A sponge that is put into the vagina before sex.  A chemical that kills or stops sperm from getting into the cervix and uterus (spermicide). The chemical may be a cream, jelly, foam, or pill. Intrauterine (IUD) birth control  IUD birth control is a small, T-shaped piece of plastic. The plastic is put inside the uterus. There are 2 types of IUD:  Copper IUD. The IUD is covered in copper wire. The copper makes a fluid that kills sperm. It can stay in place for 10 years.  Hormone IUD. The hormone stops pregnancy from happening. It can stay in place for 5 years. Permanent methods  When the woman has her fallopian tubes sealed, tied, or blocked during surgery. This stops the egg from traveling to the uterus.  The doctor places a small coil or insert into each fallopian tube. This causes scar tissue to form and blocks the fallopian tubes.  When the female has the tubes that carry sperm tied off (vasectomy). Natural family planning birth control  Natural family planning means not having sex or using barrier birth control on the days the woman could become pregnant.  Use a calendar to keep track of the length of each period and know the days she can get pregnant.  Avoid sex during ovulation.  Use a thermometer to measure body temperature. Also watch for symptoms of ovulation.  Time sex to be after the woman has ovulated. Use condoms to help protect yourself against sexually transmitted infections (STIs). Do this no matter what type of birth control you use. Talk to your doctor about which type of birth control is best for you. This information is not intended to replace advice given to you by your health care provider. Make sure you discuss any questions you have with your health care provider. Document Released: 12/21/2008 Document Revised: 08/01/2015 Document Reviewed: 09/14/2012 Elsevier Interactive Patient Education  2017 [employer].

## 2016-03-12 NOTE — Progress Notes (Signed)
   PRENATAL VISIT NOTE  Subjective:  Caitlyn [name] is a 26 y.o. G4P0030 at 3946w5d being seen today for ongoing prenatal care.  She is currently monitored for the following issues for this low-risk pregnancy and has Encounter for supervision of other normal pregnancy; Limited prenatal care in third trimester; Rh negative state in antepartum period, third trimester; UTI in pregnancy; Abnormal glucose affecting pregnancy; and Language barrier on her problem list.  Patient reports occasional contractions.  Contractions: Irritability. Vag. Bleeding: None.  Movement: (!) Decreased. Denies leaking of fluid.   The following portions of the patient's history were reviewed and updated as appropriate: allergies, current medications, past family history, past medical history, past social history, past surgical history and problem list. Problem list updated.  Objective:   Vitals:   03/12/16 1250  BP: 119/79  Pulse: (!) 111  Weight: 157 lb 9.6 oz (71.5 kg)    Fetal Status: Fetal Heart Rate (bpm): 138 Fundal Height: 38 cm Movement: (!) Decreased     General:  Alert, oriented and cooperative. Patient is in no acute distress.  Skin: Skin is warm and dry. No rash noted.   Cardiovascular: Normal heart rate noted  Respiratory: Normal respiratory effort, no problems with respiration noted  Abdomen: Soft, gravid, appropriate for gestational age. Pain/Pressure: Present     Pelvic:  Cervical exam deferred        Extremities: Normal range of motion.  Edema: None  Mental Status: Normal mood and affect. Normal behavior. Normal judgment and thought content.   Assessment and Plan:  Pregnancy: G4P0030 at 3946w5d  1. Urinary tract infection in mother during third trimester of pregnancy -pt completed abx previously prescribed; no urinary complaints at this time - Culture, OB Urine  2. Encounter for supervision of other normal pregnancy in third trimester -EDD updated per 12 week ultrasound done in [place]. Records  scanned in. Makes pt 2746w5d today; will have pt return Monday for NST; discussed IOL at 41 wks if necessary  3. Language barrier -video interpreter used -- Arabic  4. Decreased fetal movements in third trimester, single or unspecified fetus -reactive NST -fetal movement returned during monitoring  Term labor symptoms and general obstetric precautions including but not limited to vaginal bleeding, contractions, leaking of fluid and fetal movement were reviewed in detail with the patient. Please refer to After Visit Summary for other counseling recommendations.  Return in about 4 days (around 03/16/2016) for NST only 1/8; OB f/u & NST in 1 week.   Judeth HornErin Jade Burkard, NP

## 2016-03-14 ENCOUNTER — Inpatient Hospital Stay (HOSPITAL_COMMUNITY): Payer: Medicaid Other | Admitting: Anesthesiology

## 2016-03-14 ENCOUNTER — Inpatient Hospital Stay (HOSPITAL_COMMUNITY)
Admission: AD | Admit: 2016-03-14 | Discharge: 2016-03-16 | DRG: 775 | Disposition: A | Discharge status: Home / Self Care | Payer: Medicaid Other | Source: Ambulatory Visit | Attending: Obstetrics & Gynecology | Admitting: Obstetrics & Gynecology

## 2016-03-14 ENCOUNTER — Encounter (HOSPITAL_COMMUNITY): Payer: Self-pay | Admitting: *Deleted

## 2016-03-14 DIAGNOSIS — Z3483 Encounter for supervision of other normal pregnancy, third trimester: Secondary | ICD-10-CM

## 2016-03-14 DIAGNOSIS — Z3A40 40 weeks gestation of pregnancy: Secondary | ICD-10-CM

## 2016-03-14 DIAGNOSIS — Z6791 Unspecified blood type, Rh negative: Secondary | ICD-10-CM

## 2016-03-14 DIAGNOSIS — O0933 Supervision of pregnancy with insufficient antenatal care, third trimester: Secondary | ICD-10-CM

## 2016-03-14 DIAGNOSIS — Z3493 Encounter for supervision of normal pregnancy, unspecified, third trimester: Secondary | ICD-10-CM | POA: Diagnosis present

## 2016-03-14 DIAGNOSIS — O26893 Other specified pregnancy related conditions, third trimester: Secondary | ICD-10-CM | POA: Diagnosis present

## 2016-03-14 LAB — CBC
HCT: 35.2 % — ABNORMAL LOW (ref 36.0–46.0)
HEMATOCRIT: 27.4 % — AB (ref 36.0–46.0)
Hemoglobin: 12.5 g/dL (ref 12.0–15.0)
Hemoglobin: 9.9 g/dL — ABNORMAL LOW (ref 12.0–15.0)
MCH: 31.7 pg (ref 26.0–34.0)
MCH: 32.5 pg (ref 26.0–34.0)
MCHC: 35.5 g/dL (ref 30.0–36.0)
MCHC: 36.1 g/dL — ABNORMAL HIGH (ref 30.0–36.0)
MCV: 89.3 fL (ref 78.0–100.0)
MCV: 89.8 fL (ref 78.0–100.0)
PLATELETS: 161 10*3/uL (ref 150–400)
Platelets: 181 K/uL (ref 150–400)
RBC: 3.94 MIL/uL (ref 3.87–5.11)
RBC: 3.05 MIL/uL — ABNORMAL LOW (ref 3.87–5.11)
RDW: 14.8 % (ref 11.5–15.5)
RDW: 14.9 % (ref 11.5–15.5)
WBC: 13.2 K/uL — ABNORMAL HIGH (ref 4.0–10.5)
WBC: 13.4 10*3/uL — AB (ref 4.0–10.5)

## 2016-03-14 LAB — BASIC METABOLIC PANEL
ANION GAP: 9 (ref 5–15)
BUN: 6 mg/dL (ref 6–20)
CALCIUM: 8.8 mg/dL — AB (ref 8.9–10.3)
CO2: 20 mmol/L — AB (ref 22–32)
CREATININE: 0.59 mg/dL (ref 0.44–1.00)
Chloride: 107 mmol/L (ref 101–111)
GFR calc Af Amer: >60 mL/min (ref >60)
GLUCOSE: 116 mg/dL — AB (ref 65–99)
Potassium: 3.3 mmol/L — ABNORMAL LOW (ref 3.5–5.1)
Sodium: 136 mmol/L (ref 135–145)

## 2016-03-14 LAB — GLUCOSE, CAPILLARY: Glucose-Capillary: 174 mg/dL — ABNORMAL HIGH (ref 65–99)

## 2016-03-14 LAB — RPR: RPR Ser Ql: NONREACTIVE

## 2016-03-14 MED ORDER — ONDANSETRON HCL 4 MG/2ML IJ SOLN: 4.0000 mg | INTRAMUSCULAR | Status: DC | PRN

## 2016-03-14 MED ORDER — LACTATED RINGERS IV SOLN: 500.0000 mL | INTRAVENOUS | Status: DC | PRN

## 2016-03-14 MED ORDER — OXYCODONE-ACETAMINOPHEN 5-325 MG PO TABS: 1.0000 | ORAL_TABLET | ORAL | Status: DC | PRN

## 2016-03-14 MED ORDER — DIBUCAINE 1 % RE OINT: 1.0000 "application " | TOPICAL_OINTMENT | RECTAL | Status: DC | PRN

## 2016-03-14 MED ORDER — LIDOCAINE HCL (PF) 1 % IJ SOLN
30.0000 mL | INTRAMUSCULAR | Status: AC | PRN
  Administered 2016-03-14: 30 mL via SUBCUTANEOUS
  Filled 2016-03-14: qty 30

## 2016-03-14 MED ORDER — EPHEDRINE 5 MG/ML INJ
10.0000 mg | INTRAVENOUS | Status: DC | PRN
  Filled 2016-03-14: qty 4

## 2016-03-14 MED ORDER — IBUPROFEN 600 MG PO TABS
600.0000 mg | ORAL_TABLET | Freq: Four times a day (QID) | ORAL | Status: DC
  Administered 2016-03-14 – 2016-03-16 (×7): 600 mg via ORAL
  Filled 2016-03-14 (×7): qty 1

## 2016-03-14 MED ORDER — SODIUM CHLORIDE 0.9% FLUSH: 3.0000 mL | INTRAVENOUS | Status: DC | PRN

## 2016-03-14 MED ORDER — SENNOSIDES-DOCUSATE SODIUM 8.6-50 MG PO TABS
2.0000 | ORAL_TABLET | ORAL | Status: DC
  Administered 2016-03-14 – 2016-03-16 (×2): 2 via ORAL
  Filled 2016-03-14 (×2): qty 2

## 2016-03-14 MED ORDER — ACETAMINOPHEN 325 MG PO TABS: 650.0000 mg | ORAL_TABLET | ORAL | Status: DC | PRN

## 2016-03-14 MED ORDER — OXYTOCIN 40 UNITS IN LACTATED RINGERS INFUSION - SIMPLE MED
2.5000 [IU]/h | INTRAVENOUS | Status: DC
  Filled 2016-03-14: qty 1000

## 2016-03-14 MED ORDER — ZOLPIDEM TARTRATE 5 MG PO TABS: 5.0000 mg | ORAL_TABLET | Freq: Every evening | ORAL | Status: DC | PRN

## 2016-03-14 MED ORDER — FENTANYL CITRATE (PF) 100 MCG/2ML IJ SOLN
100.0000 ug | INTRAMUSCULAR | Status: DC | PRN
  Administered 2016-03-14 (×2): 100 ug via INTRAVENOUS
  Filled 2016-03-14 (×2): qty 2

## 2016-03-14 MED ORDER — FLEET ENEMA 7-19 GM/118ML RE ENEM: 1.0000 | ENEMA | RECTAL | Status: DC | PRN

## 2016-03-14 MED ORDER — LACTATED RINGERS IV SOLN
INTRAVENOUS | Status: DC
  Administered 2016-03-14 (×2): via INTRAVENOUS

## 2016-03-14 MED ORDER — SIMETHICONE 80 MG PO CHEW
80.0000 mg | CHEWABLE_TABLET | ORAL | Status: DC | PRN
Start: 2016-03-14 — End: 2016-03-16

## 2016-03-14 MED ORDER — PHENYLEPHRINE 40 MCG/ML (10ML) SYRINGE FOR IV PUSH (FOR BLOOD PRESSURE SUPPORT)
80.0000 ug | PREFILLED_SYRINGE | INTRAVENOUS | Status: DC | PRN
  Filled 2016-03-14: qty 5

## 2016-03-14 MED ORDER — PRENATAL MULTIVITAMIN CH
1.0000 | ORAL_TABLET | Freq: Every day | ORAL | Status: DC
  Administered 2016-03-15: 1 via ORAL
  Filled 2016-03-14: qty 1

## 2016-03-14 MED ORDER — PHENYLEPHRINE 40 MCG/ML (10ML) SYRINGE FOR IV PUSH (FOR BLOOD PRESSURE SUPPORT)
80.0000 ug | PREFILLED_SYRINGE | INTRAVENOUS | Status: DC | PRN
  Filled 2016-03-14: qty 5
  Filled 2016-03-14: qty 10

## 2016-03-14 MED ORDER — LIDOCAINE HCL (PF) 1 % IJ SOLN
INTRAMUSCULAR | Status: DC | PRN
  Administered 2016-03-14 (×2): 6 mL via EPIDURAL

## 2016-03-14 MED ORDER — SODIUM CHLORIDE 0.9 % IV SOLN: 250.0000 mL | INTRAVENOUS | Status: DC | PRN

## 2016-03-14 MED ORDER — TETANUS-DIPHTH-ACELL PERTUSSIS 5-2.5-18.5 LF-MCG/0.5 IM SUSP: 0.5000 mL | Freq: Once | INTRAMUSCULAR | Status: DC

## 2016-03-14 MED ORDER — SOD CITRATE-CITRIC ACID 500-334 MG/5ML PO SOLN: 30.0000 mL | ORAL | Status: DC | PRN

## 2016-03-14 MED ORDER — OXYTOCIN 40 UNITS IN LACTATED RINGERS INFUSION - SIMPLE MED: 2.5000 [IU]/h | INTRAVENOUS | Status: DC | PRN

## 2016-03-14 MED ORDER — ONDANSETRON HCL 4 MG PO TABS: 4.0000 mg | ORAL_TABLET | ORAL | Status: DC | PRN

## 2016-03-14 MED ORDER — BENZOCAINE-MENTHOL 20-0.5 % EX AERO
1.0000 "application " | INHALATION_SPRAY | CUTANEOUS | Status: DC | PRN
  Administered 2016-03-15: 1 via TOPICAL
  Filled 2016-03-14: qty 56

## 2016-03-14 MED ORDER — OXYCODONE HCL 5 MG PO TABS: 5.0000 mg | ORAL_TABLET | Freq: Four times a day (QID) | ORAL | Status: DC | PRN

## 2016-03-14 MED ORDER — WITCH HAZEL-GLYCERIN EX PADS: 1.0000 "application " | MEDICATED_PAD | CUTANEOUS | Status: DC | PRN

## 2016-03-14 MED ORDER — OXYCODONE-ACETAMINOPHEN 5-325 MG PO TABS: 2.0000 | ORAL_TABLET | ORAL | Status: DC | PRN

## 2016-03-14 MED ORDER — FENTANYL 2.5 MCG/ML BUPIVACAINE 1/10 % EPIDURAL INFUSION (WH - ANES)
14.0000 mL/h | INTRAMUSCULAR | Status: DC | PRN
  Administered 2016-03-14: 14 mL/h via EPIDURAL
  Filled 2016-03-14: qty 100

## 2016-03-14 MED ORDER — SODIUM CHLORIDE 0.9 % IV BOLUS (SEPSIS): 500.0000 mL | Freq: Once | INTRAVENOUS | Status: DC

## 2016-03-14 MED ORDER — ACETAMINOPHEN 325 MG PO TABS
650.0000 mg | ORAL_TABLET | ORAL | Status: DC | PRN
  Administered 2016-03-14: 650 mg via ORAL
  Filled 2016-03-14 (×2): qty 2

## 2016-03-14 MED ORDER — SODIUM CHLORIDE 0.9% FLUSH: 3.0000 mL | Freq: Two times a day (BID) | INTRAVENOUS | Status: DC

## 2016-03-14 MED ORDER — DIPHENHYDRAMINE HCL 25 MG PO CAPS: 25.0000 mg | ORAL_CAPSULE | Freq: Four times a day (QID) | ORAL | Status: DC | PRN

## 2016-03-14 MED ORDER — COCONUT OIL OIL: 1.0000 "application " | TOPICAL_OIL | Status: DC | PRN

## 2016-03-14 MED ORDER — ONDANSETRON HCL 4 MG/2ML IJ SOLN: 4.0000 mg | Freq: Four times a day (QID) | INTRAMUSCULAR | Status: DC | PRN

## 2016-03-14 MED ORDER — DIPHENHYDRAMINE HCL 50 MG/ML IJ SOLN: 12.5000 mg | INTRAMUSCULAR | Status: DC | PRN

## 2016-03-14 MED ORDER — LACTATED RINGERS IV SOLN
500.0000 mL | Freq: Once | INTRAVENOUS | Status: AC
  Administered 2016-03-14: 500 mL via INTRAVENOUS

## 2016-03-14 MED ORDER — OXYTOCIN BOLUS FROM INFUSION
500.0000 mL | Freq: Once | INTRAVENOUS | Status: AC
  Administered 2016-03-14: 500 mL via INTRAVENOUS

## 2016-03-14 NOTE — Progress Notes (Signed)
   Caitlyn [name] is a 26 y.o. G3P0020 at 6966w0d  admitted for active labor  Subjective:  Resting on her left side; feeling a little bit of pressure in her back.  Objective: Vitals:   03/14/16 1249 03/14/16 1301 03/14/16 1331 03/14/16 1401  BP: 122/81 118/78 121/81 139/77  Pulse: 92 93 86 (!) 120  Resp:      Temp:      Weight:      Height:       No intake/output data recorded.  FHT:  FHR: 140 bpm, variability: moderate,  accelerations:  Present,  decelerations:  Absent UC:   regular, every 2-3 minutes SVE:   Dilation: Lip/rim Effacement (%): 100 Station: +1 Exam by:: kooistra  Labs: Lab Results  Component Value Date   WBC 13.2 (H) 03/14/2016   HGB 12.5 03/14/2016   HCT 35.2 (L) 03/14/2016   MCV 89.3 03/14/2016   PLT 181 03/14/2016    Assessment / Plan: Spontaneous labor, progressing normally  Labor: Progressing normally Fetal Wellbeing:  Category I Pain Control:  Epidural Anticipated MOD:  NSVD  Charlesetta GaribaldiKathryn Lorraine Kooistra CNM 03/14/2016, 3:53 PM

## 2016-03-14 NOTE — Anesthesia Preprocedure Evaluation (Signed)
Anesthesia Evaluation  Patient identified by MRN, date of birth, ID band Patient awake    Reviewed: Allergy & Precautions, H&P , NPO status , Patient's Chart, lab work & pertinent test results  Airway Mallampati: I  TM Distance: >3 FB Neck ROM: full    Dental no notable dental hx.    Pulmonary neg pulmonary ROS,    Pulmonary exam normal        Cardiovascular negative cardio ROS Normal cardiovascular exam     Neuro/Psych negative neurological ROS  negative psych ROS   GI/Hepatic negative GI ROS, Neg liver ROS,   Endo/Other  negative endocrine ROS  Renal/GU negative Renal ROS     Musculoskeletal   Abdominal Normal abdominal exam  (+)   Peds  Hematology   Anesthesia Other Findings   Reproductive/Obstetrics (+) Pregnancy                             Anesthesia Physical Anesthesia Plan  ASA: II  Anesthesia Plan: Epidural   Post-op Pain Management:    Induction:   Airway Management Planned:   Additional Equipment:   Intra-op Plan:   Post-operative Plan:   Informed Consent: I have reviewed the patients History and Physical, chart, labs and discussed the procedure including the risks, benefits and alternatives for the proposed anesthesia with the patient or authorized representative who has indicated his/her understanding and acceptance.     Plan Discussed with:   Anesthesia Plan Comments:         Anesthesia Quick Evaluation  

## 2016-03-14 NOTE — Progress Notes (Signed)
Stat CBC, BMet collected and sent.

## 2016-03-14 NOTE — Progress Notes (Signed)
Rapid Response called to pt s' Room#123 to assist primary nurse. Pt speaks Arabic but also speaks and understand some [place]. Interpreter called. Pt stated " I tried to get my baby and I got light-headed and almost fell". Pt remain alert and oriented. Dr Jonathon JordanGambino and CNMW at bedside.   NS 0.9 % 500 ml bolus initiated. CBG 174 mg/dll, Stat CBC, Bmet and orthostatic B/P ordered. We will continue to monitor.

## 2016-03-14 NOTE — Progress Notes (Signed)
   Caitlyn [name] is a 26 y.o. G3P0020 at 5542w0d  admitted for active labor  Subjective: Resting in bed, no complaints or distress.  Objective: Vitals:   03/14/16 1249 03/14/16 1301 03/14/16 1331 03/14/16 1401  BP: 122/81 118/78 121/81 139/77  Pulse: 92 93 86 (!) 120  Resp:      Temp:      Weight:      Height:       No intake/output data recorded.  FHT:  FHR: 130 bpm, variability: moderate,  accelerations:  Present,  decelerations:  Absent UC:   regular, every 2-4 minutes SVE:   Dilation: Lip/rim Effacement (%): 100 Station: +1 Exam by:: Korianna Washer Pitocin @ Labs: Lab Results  Component Value Date   WBC 13.2 (H) 03/14/2016   HGB 12.5 03/14/2016   HCT 35.2 (L) 03/14/2016   MCV 89.3 03/14/2016   PLT 181 03/14/2016    Assessment / Plan: Spontaneous labor, progressing normally  Labor: Progressing normally Fetal Wellbeing:  Category I Pain Control:  Epidural Anticipated MOD:  NSVD  Charlesetta GaribaldiKathryn Lorraine Travion Ke CNM 03/14/2016, 2:07 PM

## 2016-03-14 NOTE — Progress Notes (Signed)
   Caitlyn PollenLamiaa A [name] is a 26 y.o. G3P0020 at 7625w0d  admitted for active labor.  Subjective: Patient resting in bed; epidural in place.   Objective: Vitals:   03/14/16 1231 03/14/16 1236 03/14/16 1241 03/14/16 1246  BP: (!) 121/53 126/77 120/72 110/75  Pulse: 99 86 89 90  Resp:      Temp:      Weight:      Height:       No intake/output data recorded.  FHT:  FHR: 130 bpm, variability: moderate,  accelerations:  Present,  decelerations:  Absent UC:   none SVE:   Dilation: 8 Effacement (%): 100 Station: +1 Exam by:: m wilkins rnc Pitocin @ 0 mu/min  Labs: Lab Results  Component Value Date   WBC 13.2 (H) 03/14/2016   HGB 12.5 03/14/2016   HCT 35.2 (L) 03/14/2016   MCV 89.3 03/14/2016   PLT 181 03/14/2016    Assessment / Plan: Spontaneous labor, progressing normally  Labor: Progressing normally Fetal Wellbeing:  Category I Pain Control:  Epidural Anticipated MOD:  NSVD  Charlesetta GaribaldiKathryn Lorraine Elantra Caprara CNM 03/14/2016, 12:47 PM

## 2016-03-14 NOTE — Progress Notes (Signed)
Initial B/P at 2208 hrs 172/150 HR 122 bpm and at 2213 hrs B/P102/39 HR 109. Pt denies chest pain, headache or blurred vision.

## 2016-03-14 NOTE — MAU Note (Signed)
Contractions for 2 hours. Denies LOF or bleeding  

## 2016-03-14 NOTE — Progress Notes (Signed)
Interim Progress Note  *Arabic interpreter used for this visit*  S: Called to bedside by RN due to concerns for abnormal vitals and patient not feeling well. Patient is PPD#0 SVD at 1800 . Patient was in bed and heard her baby cry. She got up to tend to her baby and developed lightheadedness and had to lay back down. She was tachycardic to the low 100's and was shaking. She admits to feeling warm but also admits to feeling cold intermittently. Denies any chest pain, shortness of breath, palpitations, leg pain, weakness, or numbness. States that she has been eating and drinking ok. Has not urinated yet since delivery.   O: BP 106/62 (BP Location: Left Arm)   Pulse 97   Temp 98.4 F (36.9 C) (Oral)   Resp 18   Ht 5' 3.5" (1.613 m)   Wt 70.7 kg (155 lb 12.8 oz)   LMP 05/07/2015   Breastfeeding? Unknown   BMI 27.17 kg/m General: In NAD, well developed, well nourished, appears tired HEENT: Tacky mucous membranes Cardiovascular: Slightly tachycardic rate, normal rhythm, normal s1 and s2, no rubs, gallops, or murmurs. 2+ DP pulses bilaterally.  Respiratory: no increased work of breathing, clear to auscultation bilaterally Pelvic: firm uterus, normal amount of lochia Extremities: no edema, non tender, negative homans sign bilaterally Psych: appropriate mood and affect   Glucose: 174  A/P: Patient is a 26 y.o. [id number] who is PPD #0 after a SVD at 1800. Patient likely had presyncopal/orthostatic event after standing up to tend to her baby. Observed patient for several minutes and her symptoms have resolved. Still remains slightly tachycardic. No evidence of hypoglycemia (glucose 174), no obvious signs of DVT or PE (no asymetric calf swelling/pain or pleuritic chest pain), no obvious signs of infection (patient afebrile).  - Will continue to monitor closely throughout the night - Will obtain CBC/BMP - Ordered 500cc normal saline bolus - Baby taken to nursery for now and mother advised to avoid  ambulation at least until fluids are done running  Beaulah Dinninghristina M Gambino, MD 03/14/2016, 10:48 PM PGY-2,  [facility]

## 2016-03-14 NOTE — Anesthesia Procedure Notes (Signed)
Epidural Patient location during procedure: OB Start time: 03/14/2016 12:22 PM End time: 03/14/2016 12:25 PM  Staffing Anesthesiologist: Leilani AbleHATCHETT, Londa Mackowski Performed: anesthesiologist   Preanesthetic Checklist Completed: patient identified, surgical consent, pre-op evaluation, timeout performed, IV checked, risks and benefits discussed and monitors and equipment checked  Epidural Patient position: sitting Prep: site prepped and draped and DuraPrep Patient monitoring: continuous pulse ox and blood pressure Approach: midline Location: L3-L4 Injection technique: LOR air  Needle:  Needle type: Tuohy  Needle gauge: 17 G Needle length: 9 cm and 9 Needle insertion depth: 5 cm cm Catheter type: closed end flexible Catheter size: 19 Gauge Catheter at skin depth: 10 cm Test dose: negative and Other  Assessment Sensory level: T9 Events: blood not aspirated, injection not painful, no injection resistance, negative IV test and no paresthesia  Additional Notes Reason for block:procedure for pain

## 2016-03-14 NOTE — H&P (Addendum)
Caitlyn [name] is a 26 y.o. female 804P0030 with IUP at 5467w0d presenting for contractions. Pt states she has been having irregular, every 5-6 minutes contractions, associated with none vaginal bleeding for no hours..  Membranes are intact, with active fetal movement.   [facility]  Interpreter [id number] used for H and P and physical exam  Prenatal History/Complications: Rh negative; received Rhogram on 12/1.   Past Medical History: Past Medical History:  Diagnosis Date  . Anemia     Past Surgical History: Past Surgical History:  Procedure Laterality Date  . NO PAST SURGERIES      Obstetrical History: OB History    Gravida Para Term Preterm AB Living   4       3     SAB TAB Ectopic Multiple Live Births   2   1           Social History: Social History   Social History  . Marital status: Married    Spouse name: N/A  . Number of children: N/A  . Years of education: N/A   Social History Main Topics  . Smoking status: Never Smoker  . Smokeless tobacco: Never Used  . Alcohol use No  . Drug use: No  . Sexual activity: Yes    Birth control/ protection: None     Comment: last intercourse Jan 13 2015   Other Topics Concern  . None   Social History Narrative  . None    Family History: History reviewed. No pertinent family history.  Allergies: No Known Allergies  Prescriptions Prior to Admission  Medication Sig Dispense Refill Last Dose  . Prenatal Vit-Fe Fumarate-FA (PRENATAL MULTIVITAMIN) TABS tablet Take 1 tablet by mouth daily at 12 noon.    03/14/2016 at Unknown time     Prenatal Transfer Tool  Maternal Diabetes: No Genetic Screening: Declined Maternal Ultrasounds/Referrals: Normal Fetal Ultrasounds or other Referrals:  None Maternal Substance Abuse:  No Significant Maternal Medications:  None Significant Maternal Lab Results: None     Review of Systems   Constitutional: Negative for fever and chills Eyes: Negative for visual  disturbances Respiratory: Negative for shortness of breath, dyspnea Cardiovascular: Negative for chest pain or palpitations  Gastrointestinal: Negative for vomiting, diarrhea and constipation.  POSITIVE for abdominal pain (contractions) Genitourinary: Negative for dysuria and urgency Musculoskeletal: Negative for back pain, joint pain, myalgias  Neurological: Negative for dizziness and headaches      Blood pressure 122/69, pulse 86, temperature 98 F (36.7 C), resp. rate 18, height 5' 3.5" (1.613 m), weight 70.7 kg (155 lb 12.8 oz), last menstrual period 05/07/2015, unknown if currently breastfeeding. General appearance: alert, cooperative and no distress Lungs: clear to auscultation bilaterally Heart: regular rate and rhythm Abdomen: soft, non-tender; bowel sounds normal Pelvic: deferred Extremities: Homans sign is negative, no sign of DVT DTR's +1 Presentation: cephalic Fetal monitoring  Baseline: 135 bpm Uterine activity  Date/time of onset: 0200 AM Dilation: 4 Effacement (%): 80 Station: -2 Exam by:: Ronna PolioElizabeth D'Andrea, RN   Prenatal labs: ABO, Rh: --/--/O NEG (12/01 1815) Antibody: NEG (12/01 1815) Rubella: Immune RPR: Non Reactive (12/01 1815)  HBsAg: Negative (12/01 1815)  HIV: Non Reactive (12/01 1815)  GBS: Negative (12/11 0000)  1 hr Glucola patient got prenatal care in [place] Genetic screening  Didn't have in [place] Anatomy US Normal per patient    Results for orders placed or performed during the hospital encounter of 03/14/16 (from the past 24 hour(s))  CBC   Collection Time: 03/14/16  8:19 AM  Result Value Ref Range   WBC 13.2 (H) 4.0 - 10.5 K/uL   RBC 3.94 3.87 - 5.11 MIL/uL   Hemoglobin 12.5 12.0 - 15.0 g/dL   HCT 32.4 (L) 40.1 - 02.7 %   MCV 89.3 78.0 - 100.0 fL   MCH 31.7 26.0 - 34.0 pg   MCHC 35.5 30.0 - 36.0 g/dL   RDW 25.3 66.4 - 40.3 %   Platelets 181 150 - 400 K/uL    Assessment: Caitlyn [name] is a 26 y.o. G4P0030 with an IUP at [redacted]w[redacted]d  presenting for contractions since 0200. She denies headache, blurry vision, shortness of breath, nausea, vomiting or pain with urination. She feels positive fetal movements; denies bleeding or LOF.   Plan: #Labor: expectant management #Pain:  Per request #FWB Cat 1 #ID: GBS: negative #MOF: breast and bottle  #MOC: unsure, maybe IUD #Circ: female fetus   Marylene Land CNM 03/14/2016, 9:01 AM   Attestation statement was placed on wrong patient.  Pt is not a prior c/s and is attempting to have a vaginal delivery. Elsie Lincoln, MD 3:50 PM

## 2016-03-14 NOTE — Anesthesia Pain Management Evaluation Note (Signed)
  CRNA Pain Management Visit Note  [name], 26 y.o., female  "Hello I am a [facility] of the anesthesia team at [facility]. We have an anesthesia team available at all times to provide care throughout the hospital, including epidural management and anesthesia for C-section. I don't know your plan for the delivery whether it a natural birth, water birth, IV sedation, nitrous supplementation, doula or epidural, but we want to meet your pain goals."   1.Was your pain managed to your expectations on prior hospitalizations?   No   2.What is your expectation for pain management during this hospitalization?     Epidural  3.How can we help you reach that goal? Epidural  Record the patient's initial score and the patient's pain goal.   Pain: 8  Pain Goal: 5 [facility] wants you to be able to say your pain was always managed very well.  Caitlyn [name],Caitlyn [name] 03/14/2016

## 2016-03-14 NOTE — MAU Note (Addendum)
PNC  IN CLINIC    AND  IN [place]-   BROUGHT RECORDS   TO CLINIC.         NO VE IN CLINIC.   UC'S  HURTING  SINCE 0230.    DENIES HSV AND MRSA.   GBS-NEG

## 2016-03-15 LAB — CBC
HCT: 25.6 % — ABNORMAL LOW (ref 36.0–46.0)
HEMOGLOBIN: 9.2 g/dL — AB (ref 12.0–15.0)
MCH: 32.4 pg (ref 26.0–34.0)
MCHC: 35.9 g/dL (ref 30.0–36.0)
MCV: 90.1 fL (ref 78.0–100.0)
Platelets: 156 10*3/uL (ref 150–400)
RBC: 2.84 MIL/uL — AB (ref 3.87–5.11)
RDW: 14.9 % (ref 11.5–15.5)
WBC: 13.4 10*3/uL — ABNORMAL HIGH (ref 4.0–10.5)

## 2016-03-15 LAB — CULTURE, OB URINE

## 2016-03-15 MED ORDER — RHO D IMMUNE GLOBULIN 1500 UNIT/2ML IJ SOSY
300.0000 ug | PREFILLED_SYRINGE | Freq: Once | INTRAMUSCULAR | Status: AC
  Administered 2016-03-15: 300 ug via INTRAVENOUS
  Filled 2016-03-15: qty 2

## 2016-03-15 MED ORDER — POTASSIUM CHLORIDE CRYS ER 20 MEQ PO TBCR
40.0000 meq | EXTENDED_RELEASE_TABLET | Freq: Once | ORAL | Status: AC
  Administered 2016-03-15: 40 meq via ORAL
  Filled 2016-03-15: qty 2

## 2016-03-15 NOTE — Lactation Note (Signed)
This note was copied from a baby's chart. Lactation Consultation Note: Initial visit. Arabic Interpreter, ID [phone],  on phone at the bedside for all teaching. This is mothers first child. When I entered the room mother had infant latched on the left breast in cradle hold. Observed good latch with wide open flanged lips. Observed swallows. Mother taught breast compressions. Mother taught hand expression and mother observed her colosrum. Infant placed on alternate breast in football hold. Infant latched with good depth, observed infant with intermittent swallows. Mother informed that she could pump her breast and supplement with her own milk. She was given a harmony hand pump with instructions. Reviewed amts of colostrum mother has. Informed mother that she has milk and doesn't need to supplement with formula.  Discussed cue base feeding and cluster feeding. Advised mother to feed infant 8-12 times in 24 hours. Encouraged frequent STS. Mother receptive to teaching.   Patient Name: Caitlyn Silvio PateLamiaa [name] UVOZD'GToday's Date: 03/15/2016 Reason for consult: Initial assessment   Maternal Data    Feeding Feeding Type: Bottle Fed - Formula Length of feed: 15 min  LATCH Score/Interventions Latch: Grasps breast easily, tongue down, lips flanged, rhythmical sucking. Intervention(s): Teach feeding cues Intervention(s): Adjust position;Assist with latch;Breast compression  Audible Swallowing: Spontaneous and intermittent Intervention(s): Skin to skin;Hand expression Intervention(s): Alternate breast massage  Type of Nipple: Everted at rest and after stimulation  Comfort (Breast/Nipple): Soft / non-tender     Hold (Positioning): Assistance needed to correctly position infant at breast and maintain latch. Intervention(s): Support Pillows;Position options  LATCH Score: 9  Lactation Tools Discussed/Used     Consult Status Consult Status: Follow-up Date: 03/15/16 Follow-up type: In-patient    Stevan BornKendrick,  Delany Steury [facility] 03/15/2016, 2:58 PM

## 2016-03-15 NOTE — Progress Notes (Signed)
[facility].  Reviewed plan of care with mom and fob.  FOB is pt's Nurse, learning disabilitytranslator.  Answered all questions and denies any needs at this time.

## 2016-03-15 NOTE — Progress Notes (Signed)
Came in the room to do second assessment, pt shaking, diaphoretic, pale, stating "im so cold, I need my jacket", room temp warm.  See chart for vitals.  CNM and RR called.

## 2016-03-15 NOTE — Progress Notes (Signed)
Post Partum Day 1  Subjective: no complaints, up ad lib, voiding, tolerating PO and + flatus  Objective: Blood pressure 101/60, pulse 75, temperature 98.4 F (36.9 C), temperature source Oral, resp. rate 18, height 5' 3.5" (1.613 m), weight 70.7 kg (155 lb 12.8 oz), last menstrual period 05/07/2015, unknown if currently breastfeeding.  Physical Exam:  General: alert, cooperative, appears stated age and no distress Lochia: appropriate Uterine Fundus: firm Incision: n/a DVT Evaluation: No evidence of DVT seen on physical exam. Negative Homan's sign. No cords or calf tenderness. No significant calf/ankle edema.   Recent Labs  03/14/16 2249 03/15/16 0506  HGB 9.9* 9.2*  HCT 27.4* 25.6*    Assessment/Plan: Patient is a 26 y.o. [id number] who is PPD1 after SVD. Doing well. Had an episode last night when she felt lightheaded, feels better now. She was likely dehydrated. Will continue to monitor throughout today. Plan for discharge tomorrow   LOS: 1 day   Beaulah DinningChristina M Gilad Dugger 03/15/2016, 8:47 AM

## 2016-03-15 NOTE — Progress Notes (Signed)
CSW received consult for [facility].  CSW notes that MOB started care in [place] at 36 weeks, but notes state care in [place] prior to that.  There is no documented hx of substance abuse.  CSW is screening out referral at this time.  Please consult CSW if current needs arise or by MOB's request. 

## 2016-03-15 NOTE — Progress Notes (Signed)
Pt OOB, ambulated to bathroom, voided and pericare done, tolerated well.  Pt back to bed.  FOB at bedside. Requesting to sleep at this time.  Informed pt to call RN before getting OOB or with any changes.  Verbalized understanding.

## 2016-03-15 NOTE — Progress Notes (Signed)
Used FOB as interpreter to reinforce breastfeeding basics. Mom reporting feeds lasting only 5 min at a time. Encouraged STS, waking techniques, breast massage and compression and breastfeeding with cues.latch =8/9

## 2016-03-15 NOTE — Anesthesia Postprocedure Evaluation (Signed)
Anesthesia Post Note  Patient: Orlie PollenLamiaa A Vallee  Procedure(s) Performed: * No procedures listed *  Patient location during evaluation: Mother Baby Anesthesia Type: Epidural Level of consciousness: awake and alert and oriented Pain management: satisfactory to patient Vital Signs Assessment: post-procedure vital signs reviewed and stable Respiratory status: spontaneous breathing and nonlabored ventilation Cardiovascular status: stable Postop Assessment: no headache, no backache, no signs of nausea or vomiting, adequate PO intake and patient able to bend at knees (patient up walking) Anesthetic complications: no        Last Vitals:  Vitals:   03/15/16 0100 03/15/16 0500  BP: 112/71 101/60  Pulse: 86 75  Resp: 18 18  Temp: 37.1 C 36.9 C    Last Pain:  Vitals:   03/15/16 0500  TempSrc: Oral  PainSc:    Pain Goal: Patients Stated Pain Goal: 0 (03/14/16 0525)               Madison HickmanGREGORY,Ezequias Lard

## 2016-03-16 ENCOUNTER — Other Ambulatory Visit: Payer: Medicaid Other | Admitting: Obstetrics & Gynecology

## 2016-03-16 LAB — RH IG WORKUP (INCLUDES ABO/RH)
ABO/RH(D): O NEG
FETAL SCREEN: NEGATIVE
Gestational Age(Wks): 40
UNIT DIVISION: 0

## 2016-03-16 MED ORDER — IBUPROFEN 600 MG PO TABS: 600.0000 mg | ORAL_TABLET | Freq: Four times a day (QID) | ORAL | 0 refills | Status: AC

## 2016-03-16 NOTE — Lactation Note (Signed)
This note was copied from a baby's chart. Lactation Consultation Note  FOB interpreting for Arabic. Baby 41 hours old.  < 6 lbs. Baby at breast and unlatched upon entering.  Mother states baby breastfed for 25 min and is asleep in mother's arms. Mother plans to bf and formula feed.  Discussed the importance of bf first before offering formula to help her establish her milk supply. Discussed unwrapping baby with hat on for feedings.  Reviewed engorgement care and monitoring voids/stools. Mom encouraged to feed baby 8-12 times/24 hours and with feeding cues at least every 3 hours. Discussed volume guidelines and increasing as baby desires.  Family has volume guidelines information sheet. Offered to help with latch if desired, please call LC.   Patient Name: Caitlyn [name] ZOXWR'UToday's Date: 03/16/2016 Reason for consult: Follow-up assessment   Maternal Data    Feeding Feeding Type: Breast Fed Length of feed: 25 min (per mom off and on)  LATCH Score/Interventions                      Lactation Tools Discussed/Used     Consult Status Consult Status: Complete    Hardie PulleyBerkelhammer, Klare Criss Boschen 03/16/2016, 11:28 AM

## 2016-03-16 NOTE — Discharge Summary (Signed)
OB Discharge Summary  Patient Name: Caitlyn [name] DOB: October 16, 1990 MRN: [id number]  Date of admission: 03/14/2016 Delivering MD: Marylene Land   Date of discharge: 03/16/2016  Admitting diagnosis: 39 weeks and in labor Intrauterine pregnancy: [redacted]w[redacted]d     Secondary diagnosis:Active Problems:   Normal labor  Additional problems:none     Discharge diagnosis: Term Pregnancy Delivered                                                                     Post partum procedures:n/a  Augmentation: Pitocin  Complications: None  Hospital course:  Onset of Labor With Vaginal Delivery     26 y.o. yo [id number] at [redacted]w[redacted]d was admitted in Active Labor on 03/14/2016. Patient had an uncomplicated labor course as follows:  Membrane Rupture Time/Date: 11:10 AM ,03/14/2016   Intrapartum Procedures: Episiotomy: None [1]                                         Lacerations:  2nd degree [3]  Patient had a delivery of a Viable infant. 03/14/2016  Information for the patient's newborn:  Jesslynn, Kruck [[id number]]  Delivery Method: Vag-Spont    Pateint had an uncomplicated postpartum course.  She is ambulating, tolerating a regular diet, passing flatus, and urinating well. Patient is discharged home in stable condition on 03/16/16.    Physical exam Vitals:   03/15/16 0100 03/15/16 0500 03/15/16 0950 03/15/16 1736  BP: 112/71 101/60 99/63 (!) 98/53  Pulse: 86 75 92 92  Resp: 18 18 18 18   Temp: 98.8 F (37.1 C) 98.4 F (36.9 C) 98.9 F (37.2 C) 98.3 F (36.8 C)  TempSrc: Oral Oral Oral Oral  Weight:      Height:       General: alert, cooperative and no distress Lochia: appropriate Uterine Fundus: firm Incision: Healing well with no significant drainage DVT Evaluation: No evidence of DVT seen on physical exam. Labs: Lab Results  Component Value Date   WBC 13.4 (H) 03/15/2016   HGB 9.2 (L) 03/15/2016   HCT 25.6 (L) 03/15/2016   MCV 90.1 03/15/2016   PLT 156 03/15/2016   CMP  Latest Ref Rng & Units 03/14/2016  Glucose 65 - 99 mg/dL 130(Q)  BUN 6 - 20 mg/dL 6  Creatinine 6.57 - 8.46 mg/dL 9.62  Sodium 952 - 841 mmol/L 136  Potassium 3.5 - 5.1 mmol/L 3.3(L)  Chloride 101 - 111 mmol/L 107  CO2 22 - 32 mmol/L 20(L)  Calcium 8.9 - 10.3 mg/dL 3.2(G)  Total Protein 6.5 - 8.1 g/dL -  Total Bilirubin 0.3 - 1.2 mg/dL -  Alkaline Phos 38 - 401 U/L -  AST 15 - 41 U/L -  ALT 14 - 54 U/L -    Discharge instruction: per After Visit Summary and "Baby and Me Booklet".  After Visit Meds:  Allergies as of 03/16/2016   No Known Allergies     Medication List    TAKE these medications   ibuprofen 600 MG tablet Commonly known as:  ADVIL,MOTRIN Take 1 tablet (600 mg total) by mouth every 6 (six) hours.  prenatal multivitamin Tabs tablet Take 1 tablet by mouth daily at 12 noon.       Diet: routine diet  Activity: Advance as tolerated. Pelvic rest for 6 weeks.   Outpatient follow up:6 weeks Follow up Appt:Future Appointments Date Time Provider Department Center  03/16/2016 2:00 PM Willodean Rosenthalarolyn Harraway-Smith, MD [facility]  03/18/2016 7:40 AM Samara DeistKathryn Gena FrayLorraine Kooistra, CNM [facility]   Follow up visit: No Follow-up on file.  Postpartum contraception: Undecided  Newborn Data: Live born female  Birth Weight: 6 lb 0.1 oz (2725 g) APGAR: 8, 9  Baby Feeding: Breast Disposition:home with mother   03/16/2016 Wyvonnia DuskyMarie Lawson, CNM

## 2016-03-16 NOTE — Progress Notes (Signed)
MOM and BABY teaching completed with FOB who signed for interpretation. All questions answered.

## 2016-03-16 NOTE — Progress Notes (Signed)
Post discharge chart review completed.  

## 2016-03-16 NOTE — Progress Notes (Signed)
Dexter was used to infom mom about her baby being a baby patient and what that meant for her. Patient stated she feels fine, voiding ok per Dexter live interpreter. DC teaching with be done with FOB who signed a interpreter form stating he will interpret for mother. Mother told to breast feed infant before formula and to and to hand express. FOB looked like mom wasn't really aggressive with breast feeding.

## 2016-03-17 LAB — TYPE AND SCREEN
BLOOD PRODUCT EXPIRATION DATE: 201801172359
BLOOD PRODUCT EXPIRATION DATE: 201801172359
ISSUE DATE / TIME: 201801091049
ISSUE DATE / TIME: 201801091049
UNIT TYPE AND RH: 9500
Unit Type and Rh: 9500

## 2016-03-18 ENCOUNTER — Other Ambulatory Visit: Payer: Self-pay | Admitting: Student

## 2016-03-18 ENCOUNTER — Other Ambulatory Visit: Payer: Medicaid Other | Admitting: Student

## 2016-03-18 ENCOUNTER — Telehealth: Payer: Self-pay

## 2016-03-18 DIAGNOSIS — N3000 Acute cystitis without hematuria: Secondary | ICD-10-CM

## 2016-03-18 MED ORDER — AMOXICILLIN-POT CLAVULANATE 500-125 MG PO TABS: 1.0000 | ORAL_TABLET | Freq: Two times a day (BID) | ORAL | 0 refills | Status: AC

## 2016-03-18 NOTE — Telephone Encounter (Signed)
No answer or voicemail per arabic interpreter.  Rx sent to pharmacy for UTI. [facility] need repeat [facility] culture [facility]

## 2016-03-24 NOTE — Telephone Encounter (Signed)
Called patient with arabic interpreter, phone number was invalid and was for an office. Will send letter.

## 2016-04-22 ENCOUNTER — Encounter: Payer: Self-pay | Admitting: *Deleted

## 2016-04-22 ENCOUNTER — Ambulatory Visit: Payer: Medicaid Other | Admitting: Advanced Practice Midwife

## 2016-04-22 NOTE — Progress Notes (Signed)
Caitlyn [name] missed a scheduled postpartum appointment.  Will send letter to notify her.

## 2016-06-29 IMAGING — US US OB COMP LESS 14 WK
1 series · 15 of 28 positions shown · non-contrast
Comparison: None.

CLINICAL DATA: 24-year-old female with positive pregnancy test with
vaginal spotting and pelvic cramping for the past 2 weeks. Last
menstrual period 11/18/2014.

EXAM:
OBSTETRIC <14 WK US AND TRANSVAGINAL OB US
TECHNIQUE: Both transabdominal and transvaginal ultrasound examinations were
performed for complete evaluation of the gestation as well as the
maternal uterus, adnexal regions, and pelvic cul-de-sac.
Transvaginal technique was performed to assess early pregnancy.

[Series 1: us ob comp less 14 wk · 15 of 56 slices shown]
[im 1/56]
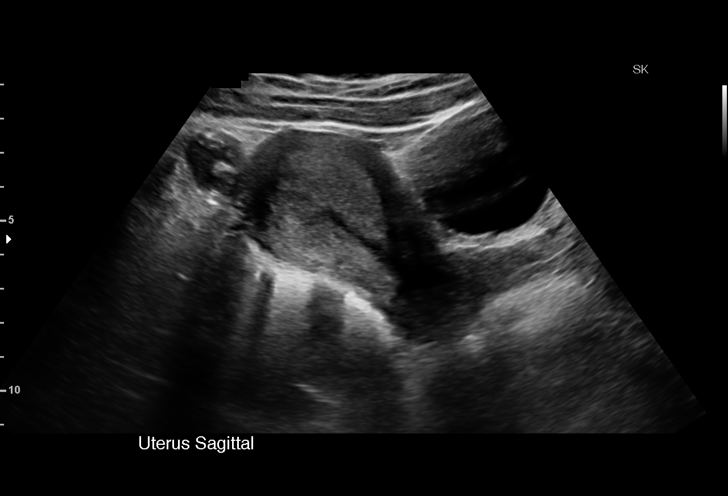
[im 5/56]
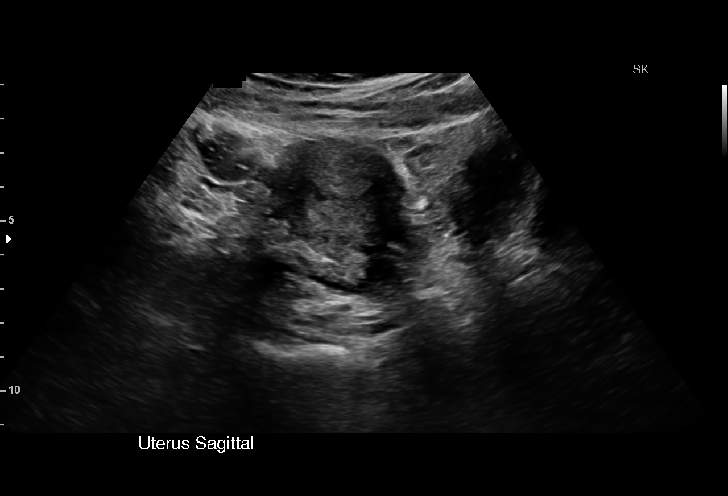
[im 9/56]
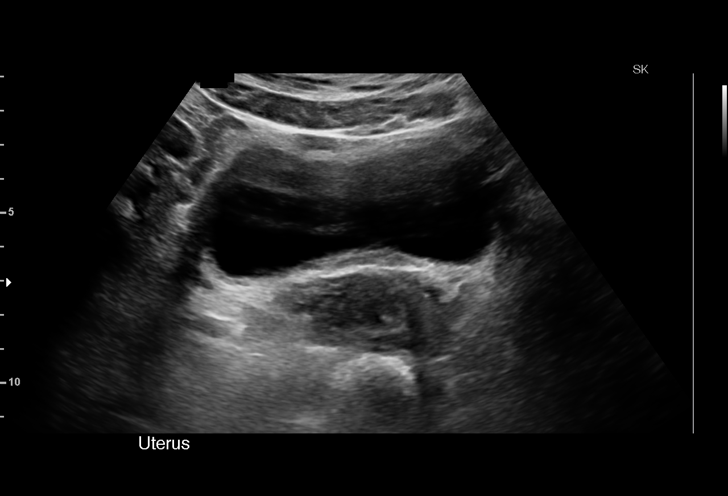
[im 13/56]
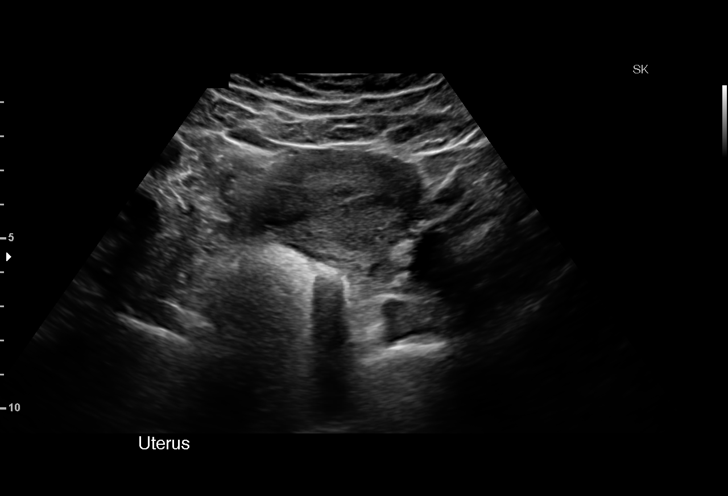
[im 17/56]
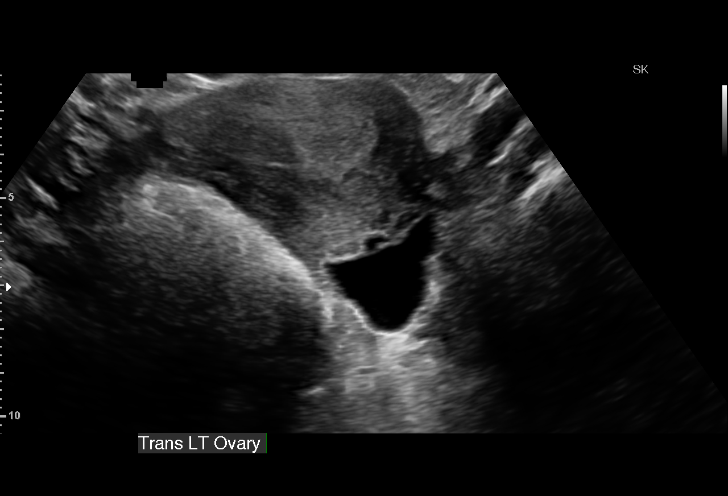
[im 21/56]
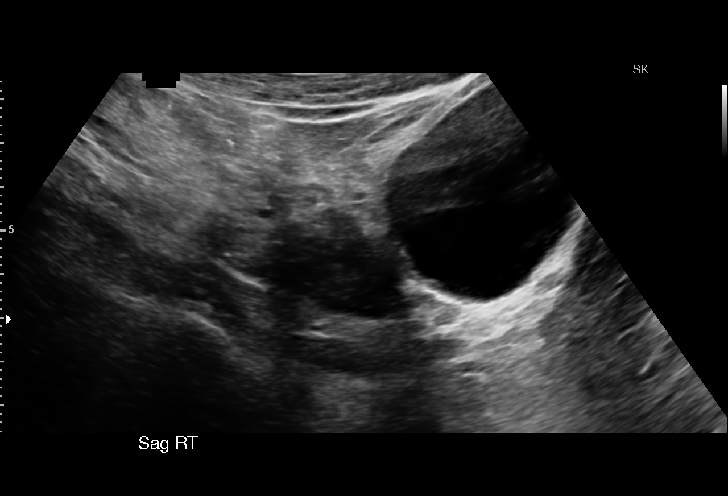
[im 25/56]
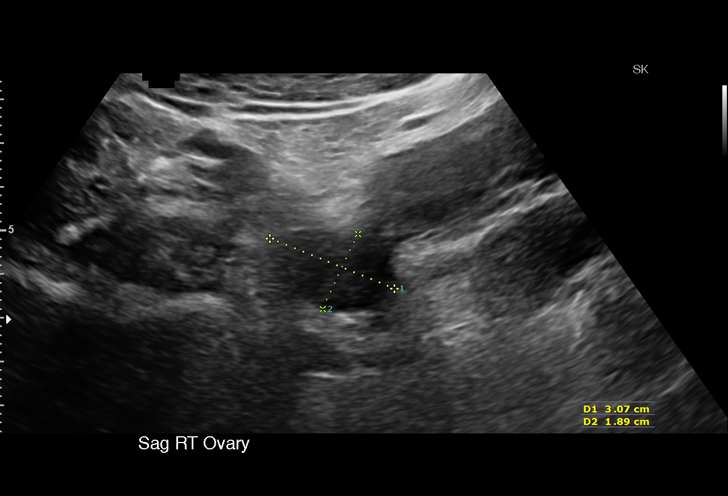
[im 29/56]
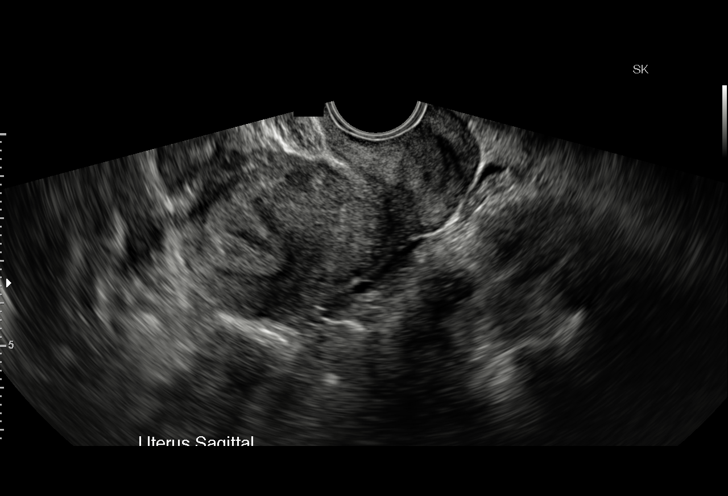
[im 31/56]
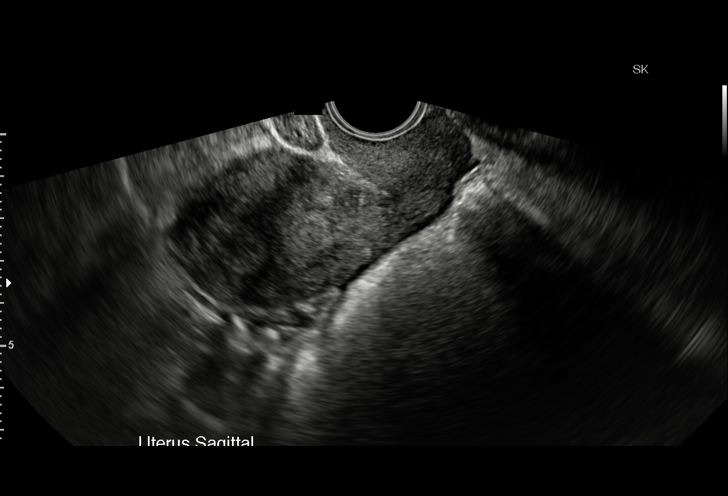
[im 35/56]
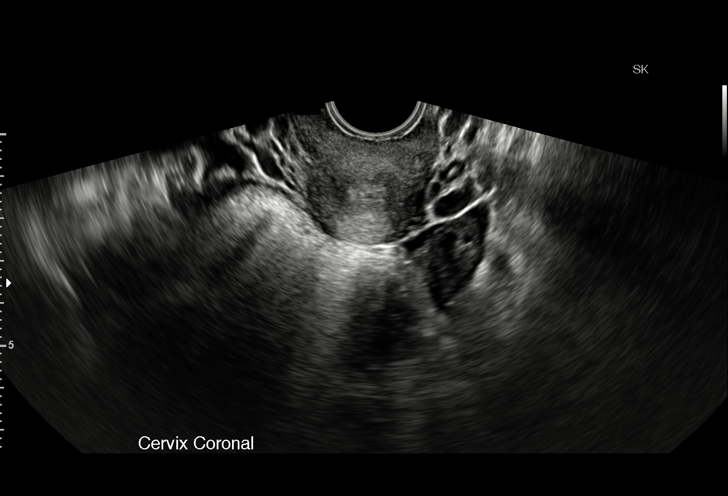
[im 39/56]
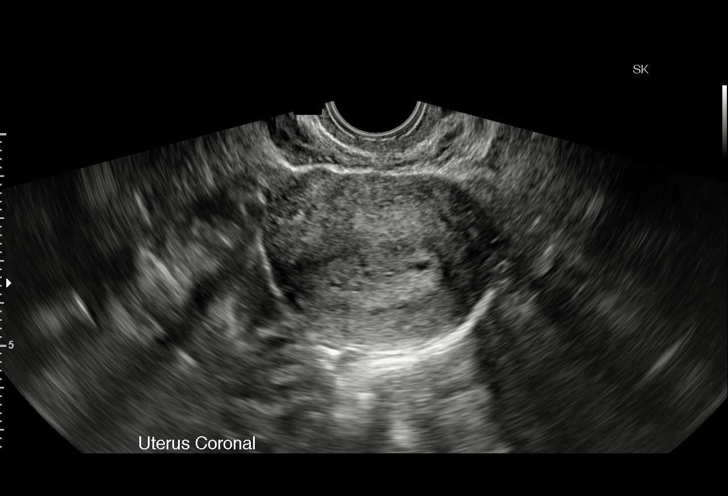
[im 43/56]
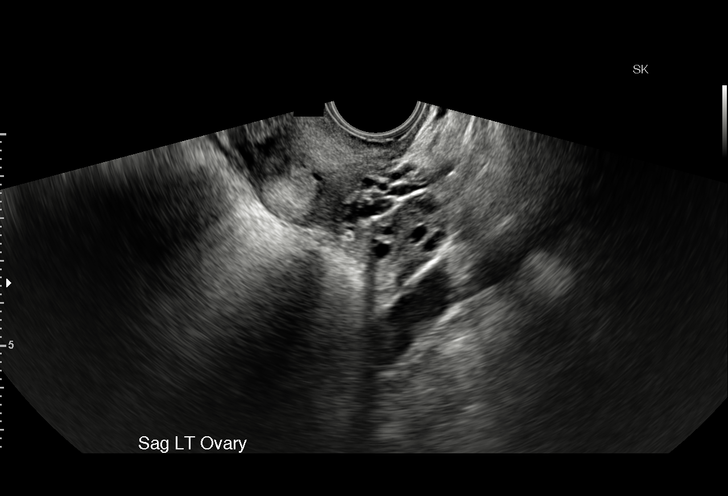
[im 47/56]
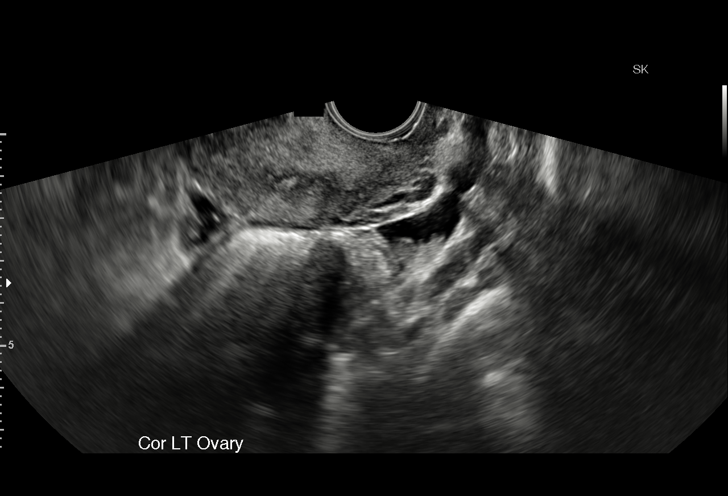
[im 51/56]
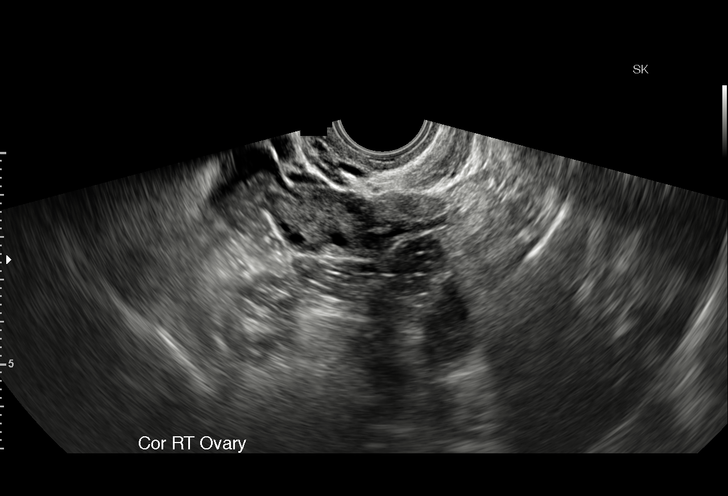
[im 56/56]
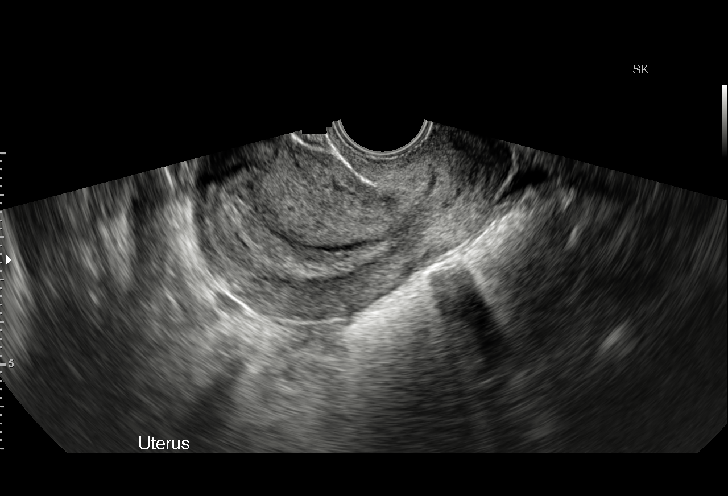

[15 of 28 positions shown; findings below may reference images not displayed]

FINDINGS: Intrauterine gestational sac: None.

Yolk sac:  None.

Embryo:  None.

Cardiac Activity: None.

Heart Rate: N/A

Maternal uterus/adnexae: Uterus is normal in appearance. Endometrium
measures approximately 11 mm in thickness. Bilateral ovaries are
normal in appearance. Small volume of free fluid, most notable in
the left adnexal region.
IMPRESSION: 1. No IUP identified.
2. Trace volume of free fluid adjacent to the left adnexa,
presumably physiologic. Fifty

## 2016-07-09 IMAGING — US US OB TRANSVAGINAL
1 series · 15 of 26 positions shown · non-contrast
Comparison: 01/03/2015 obstetric scan.

CLINICAL DATA: 24-year-old pregnant female with vaginal spotting.

EDC by LMP: 08/25/2015, projecting to an expected gestational age of
8 weeks 0 days.
EXAM:
TRANSVAGINAL OB ULTRASOUND
TECHNIQUE: Transvaginal ultrasound was performed for complete evaluation of the
gestation as well as the maternal uterus, adnexal regions, and
pelvic cul-de-sac.

[Series 1: us ob transvaginal · 15 of 26 slices shown]
[im 1/26]
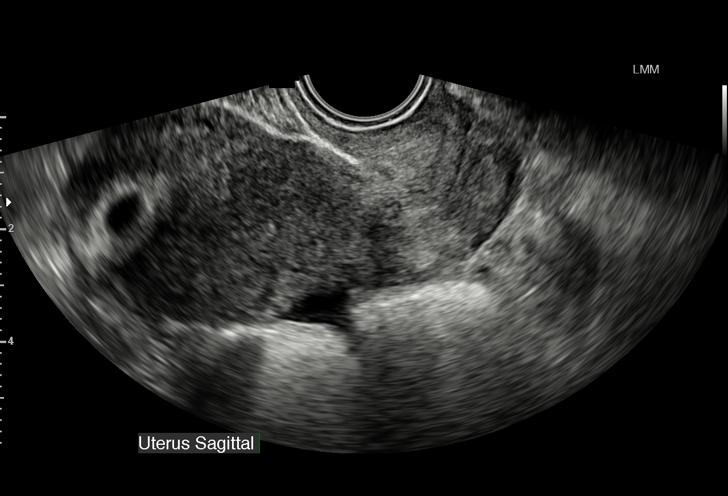
[im 3/26]
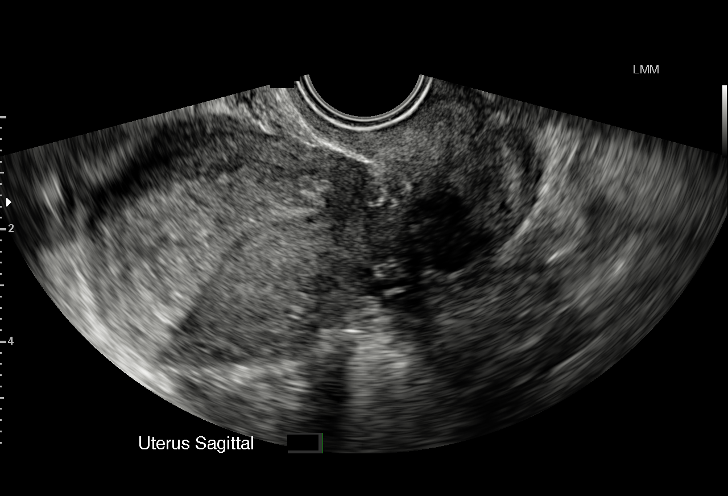
[im 5/26]
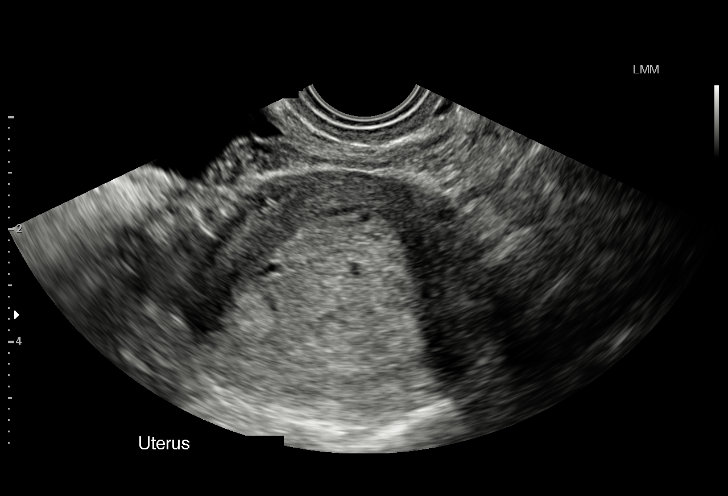
[im 7/26]
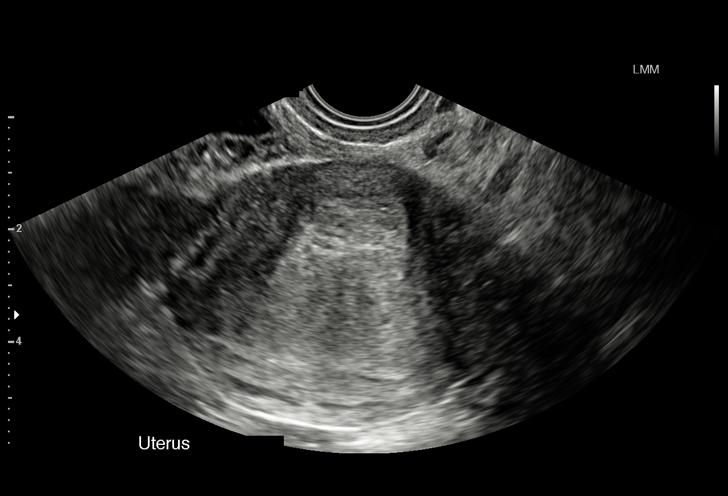
[im 8/26]
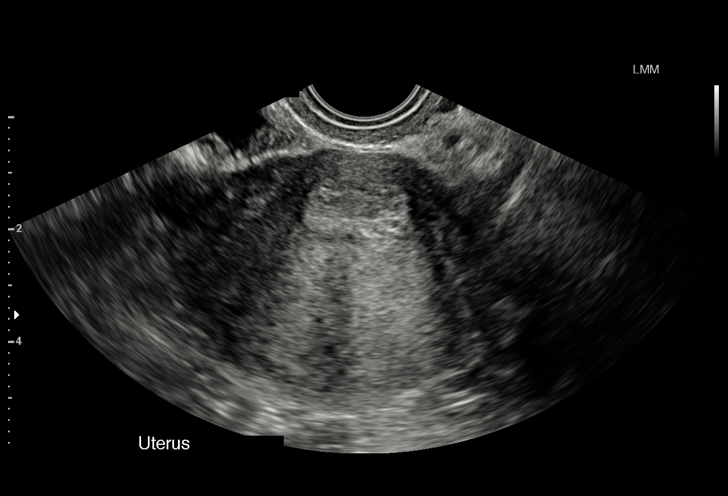
[im 10/26]
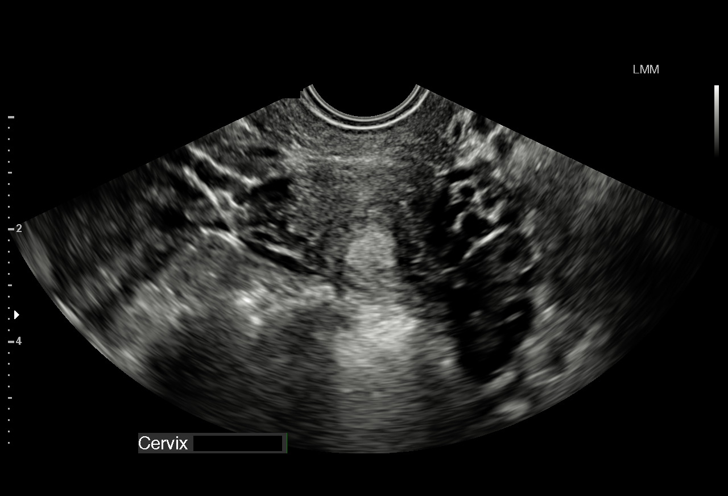
[im 12/26]
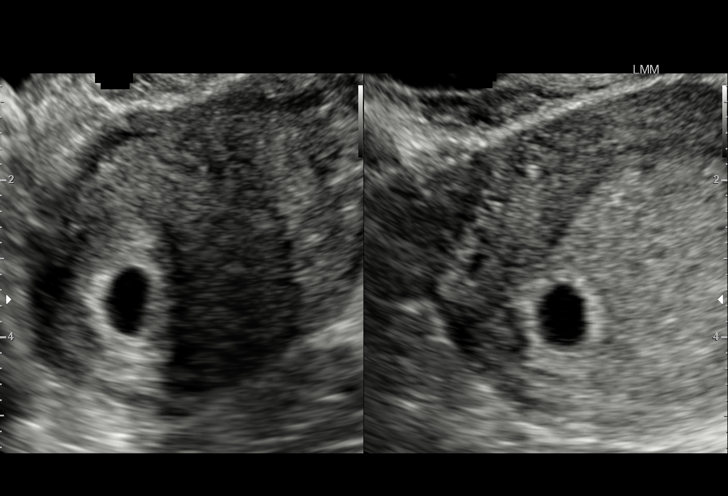
[im 14/26]
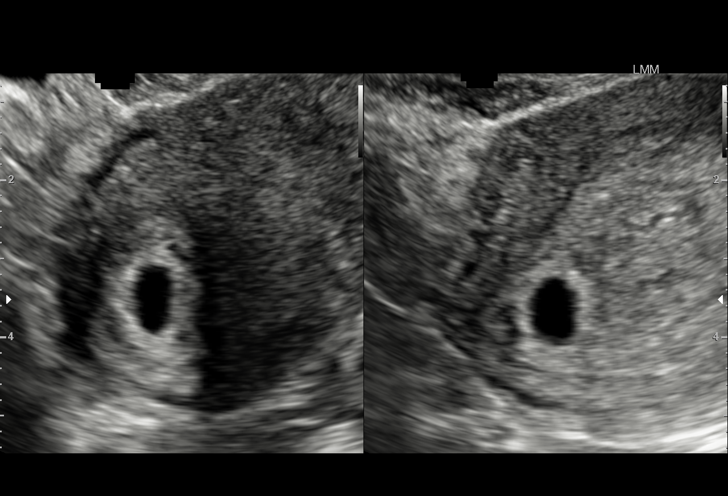
[im 15/26]
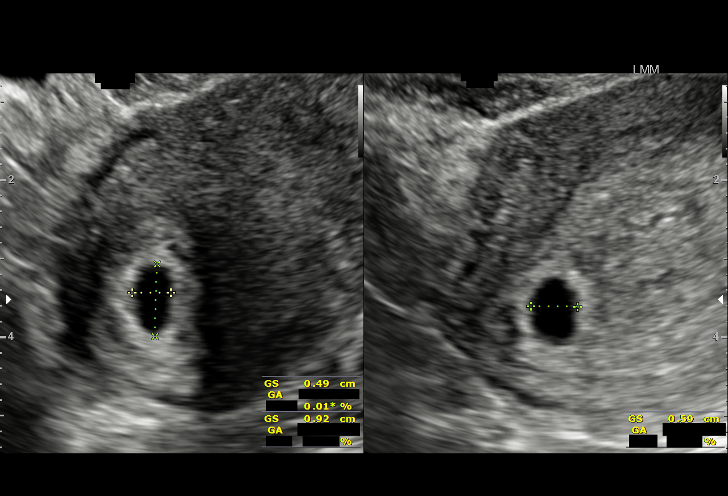
[im 17/26]
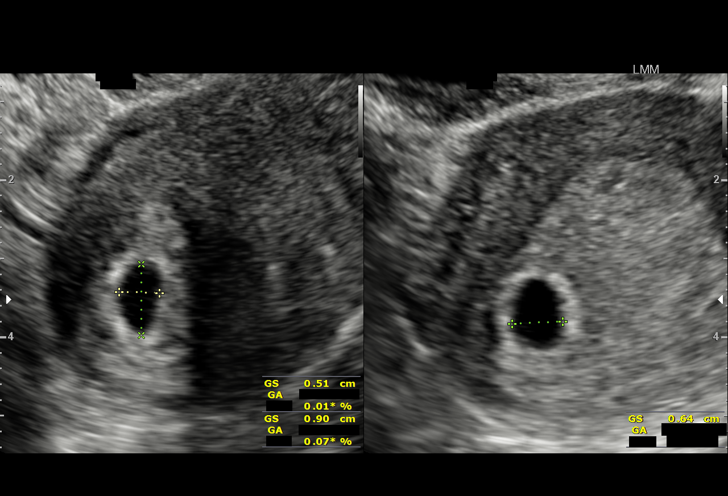
[im 19/26]
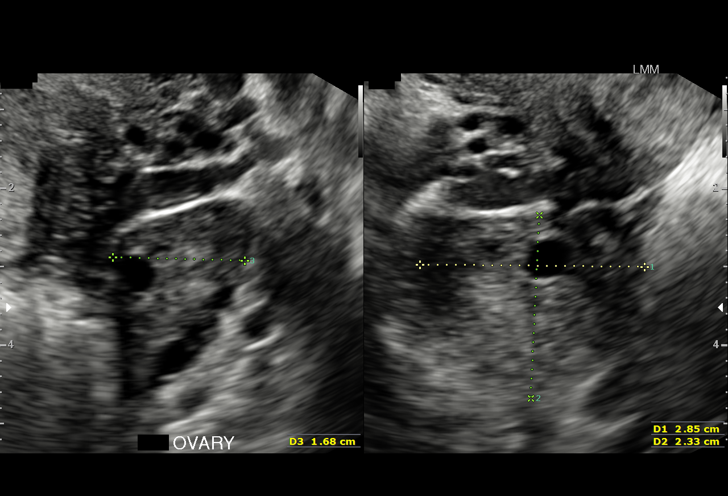
[im 20/26]
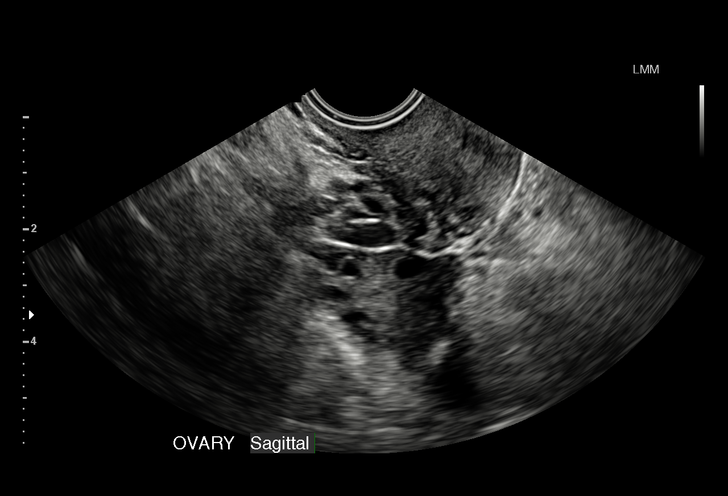
[im 22/26]
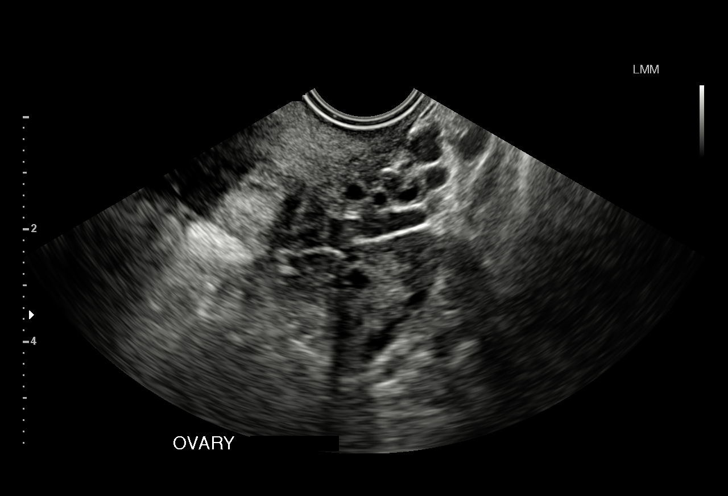
[im 24/26]
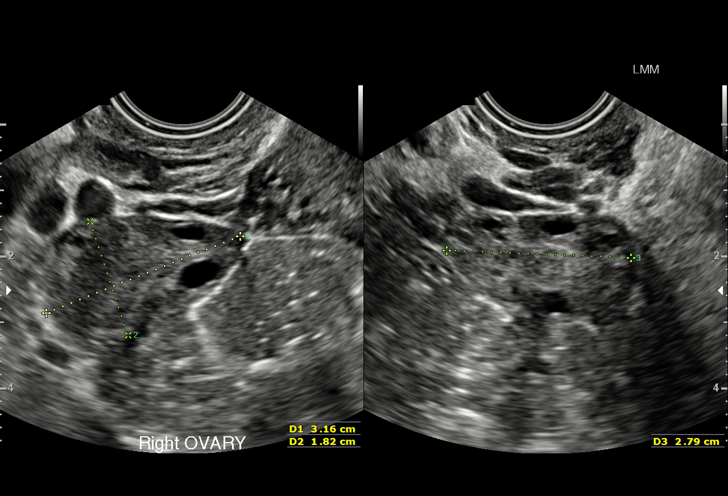
[im 26/26]
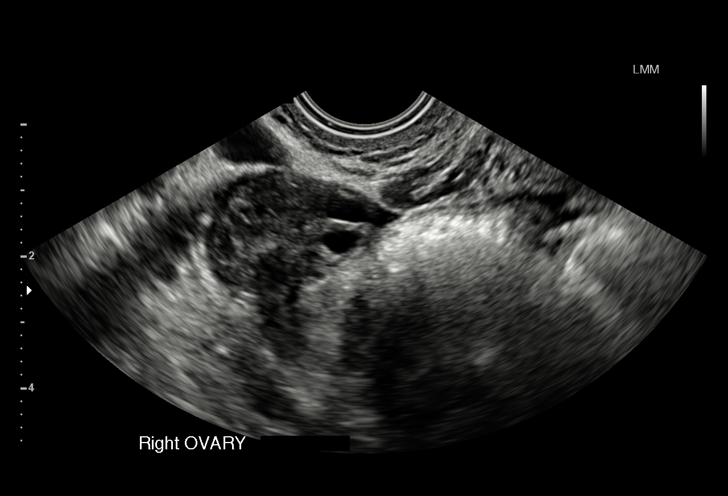

[15 of 26 positions shown; findings below may reference images not displayed]

FINDINGS: Intrauterine gestational sac: There is a single eccentric
intrauterine sac-like structure with double decidual sac sign, which
appears normal in shape. No perigestational bleed.

Yolk sac:  Not visualized.

Embryo:  Not visualized.

Cardiac Activity: Not visualized.

MSD: 6.9  mm   5 w   3  d              US EDC: 09/12/2015

Maternal uterus/adnexae: No uterine fibroids. Maternal left ovary
measures 2.9 x 2.3 x 1.7 cm. Maternal right ovary measures 3.2 x
x 2.8 cm and appears to contain a corpus luteum. No suspicious
ovarian or adnexal masses. No abnormal free fluid in the pelvis.
IMPRESSION: 1. Single intrauterine sac-like structure measuring 5 weeks 3 days
by mean sac diameter, with no definitive features of pregnancy such
as a yolk sac or embryo, which could be due to the early gestational
age. Continued close clinical follow-up with serial serum beta HCG
monitoring and follow-up obstetric scan in 2-3 weeks is advised.
2. No suspicious maternal ovarian or adnexal findings.

## 2016-09-06 IMAGING — US US OB TRANSVAGINAL
1 series · 15 of 28 positions shown · non-contrast
Comparison: None this pregnancy.

CLINICAL DATA: Pregnant patient with abdominal pain and spotting
for 2 days. History of ectopic pregnancy January 2015 post
methotrexate.

EXAM:
TRANSVAGINAL OB ULTRASOUND
TECHNIQUE: Transvaginal ultrasound was performed for complete evaluation of the
gestation as well as the maternal uterus, adnexal regions, and
pelvic cul-de-sac.

[Series 1: us ob transvaginal · 33 acquisitions, 15 frames shown]
[im 1/33]
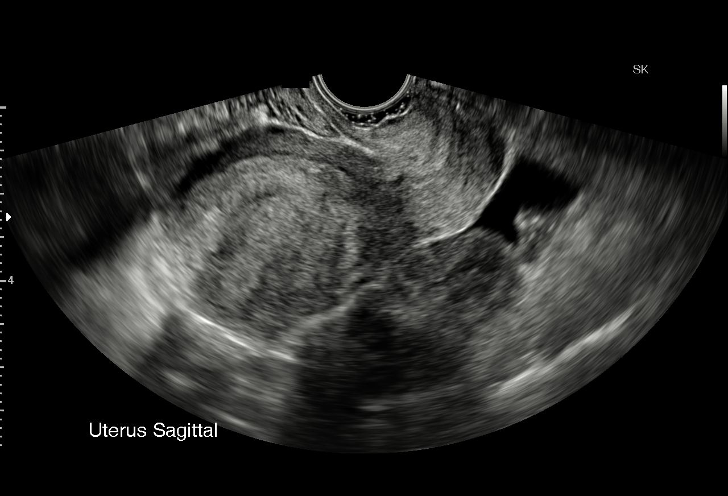
[im 3/33]
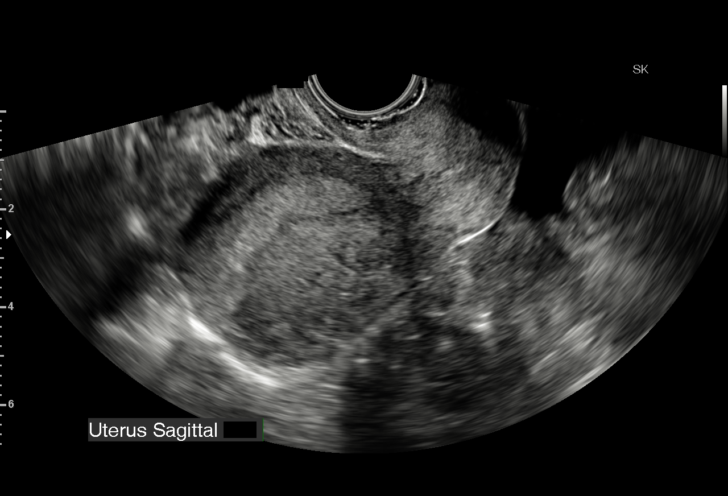
[im 5/33]
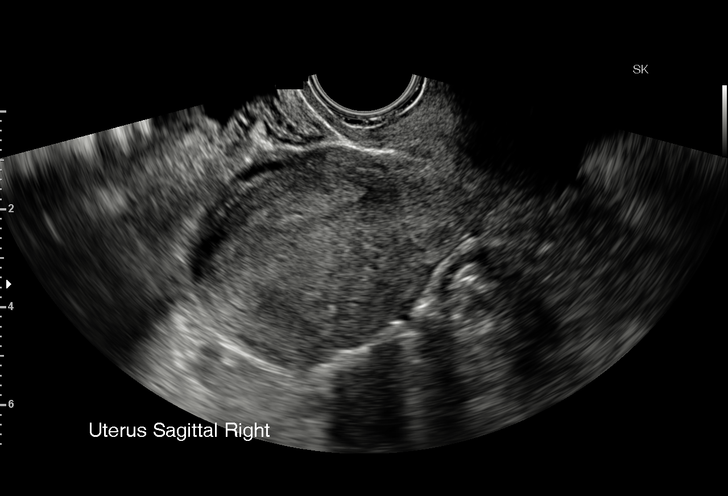
[im 8/33]
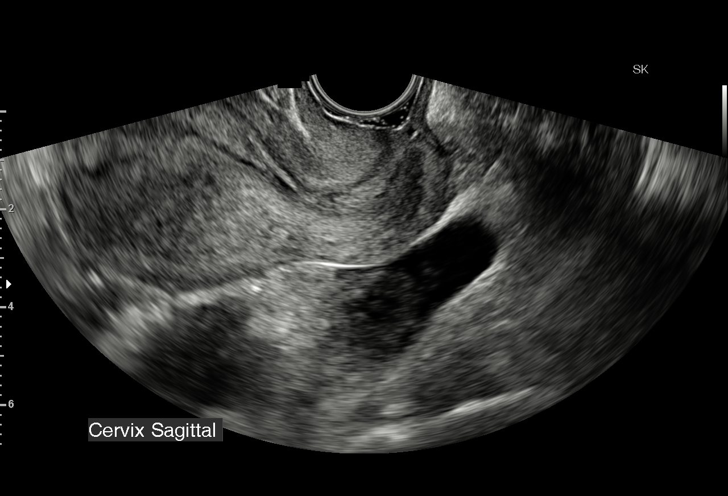
[im 10/33]
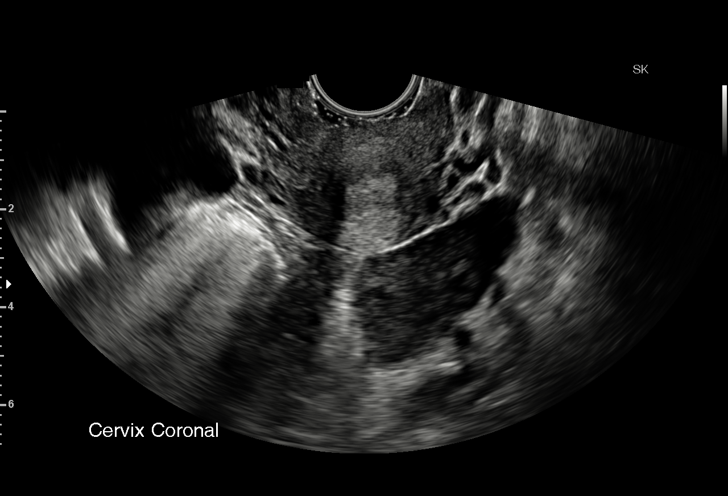
[im 12/33]
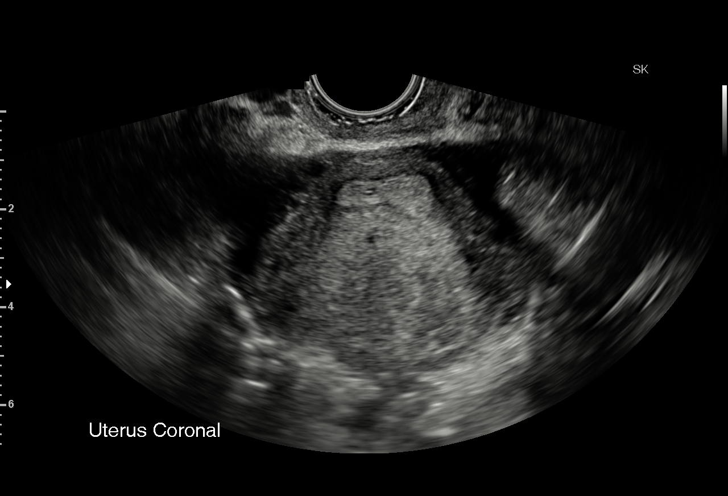
[im 15/33]
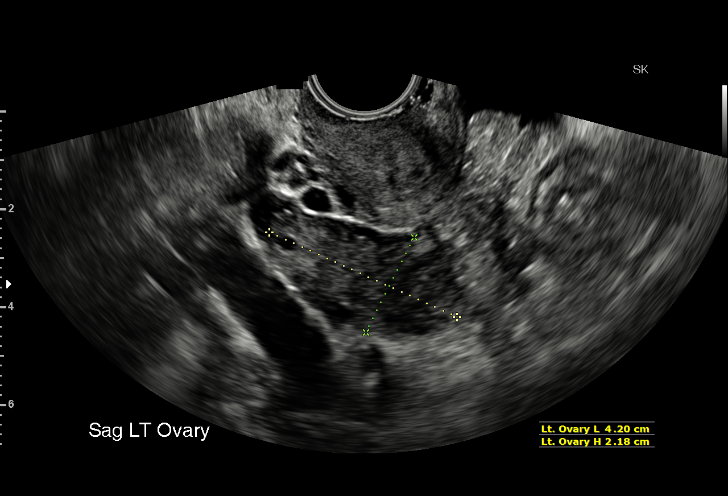
[im 17/33]
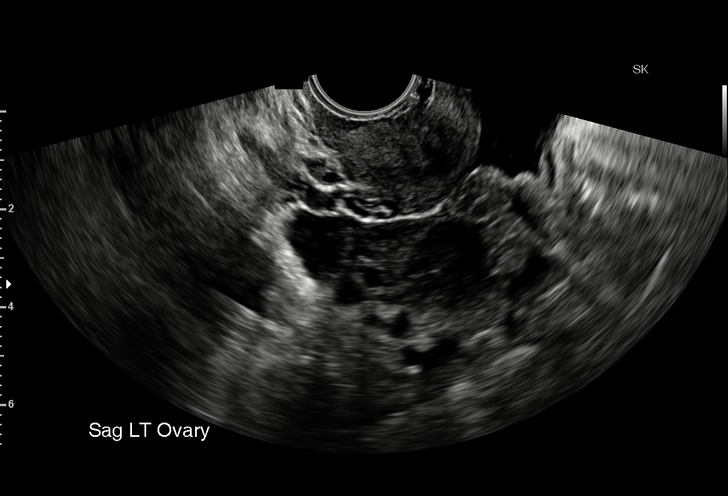
[im 18/33]
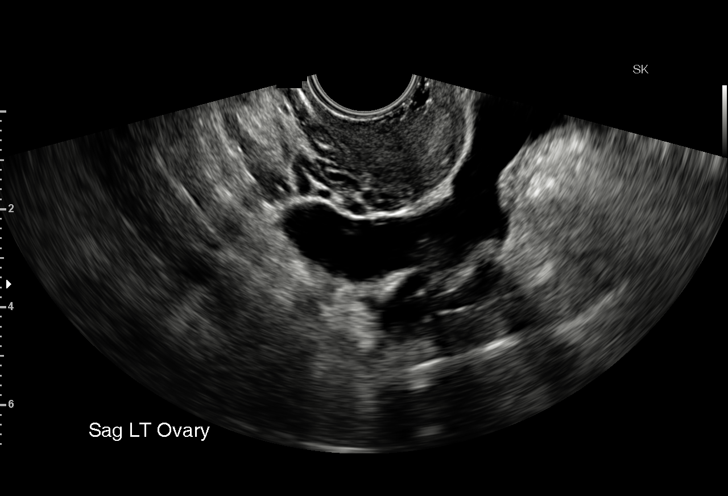
[im 21/33]
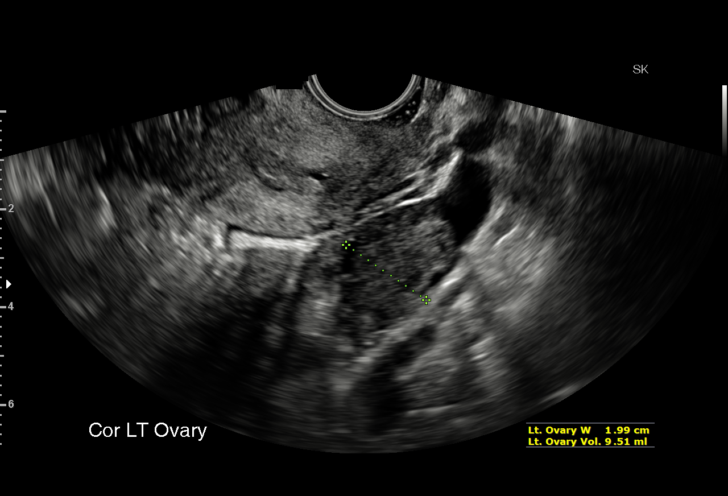
[im 23/33]
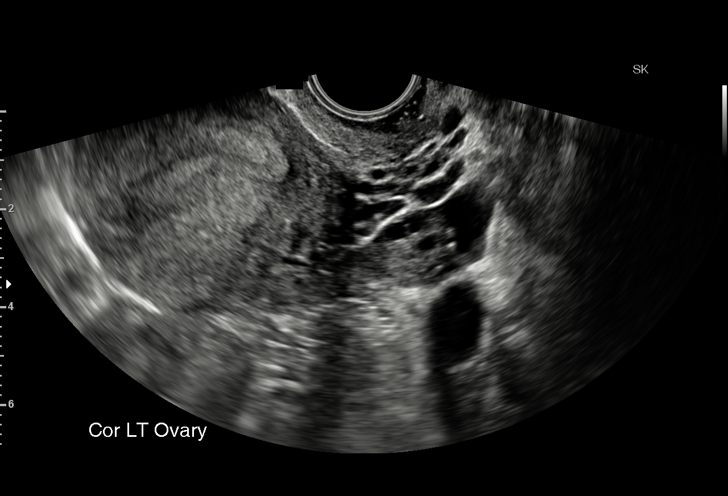
[im 25/33]
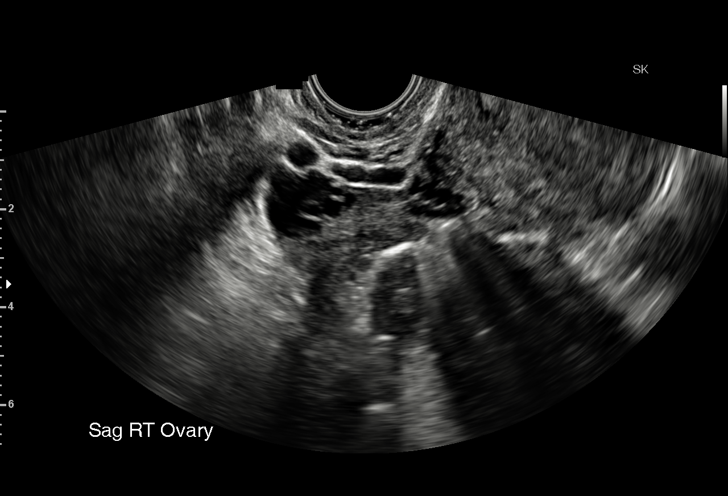
[im 28/33]
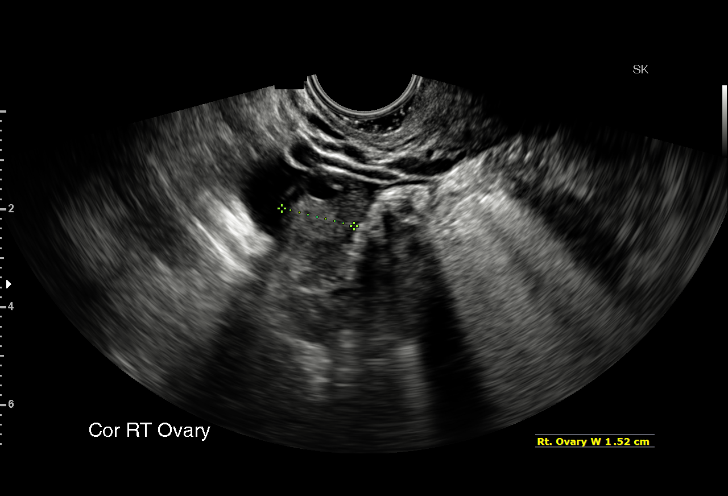
[im 30/33]
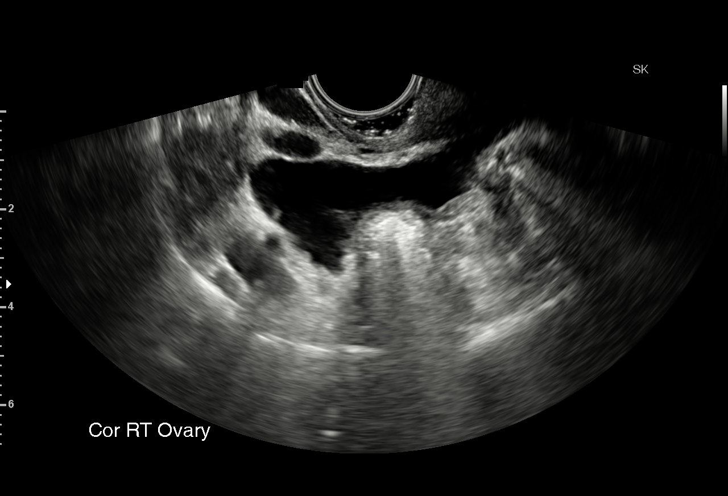
[im 33/33]
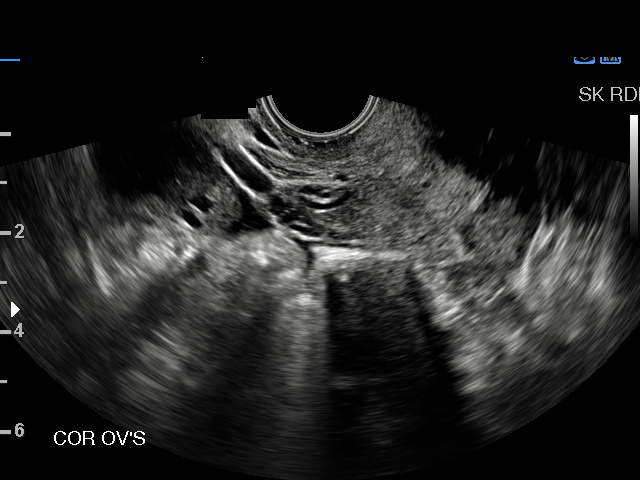

[15 of 28 positions shown; findings below may reference images not displayed]

FINDINGS: Intrauterine gestational sac:  Not present.

Yolk sac:  Not present.

Embryo:  Not present.

Maternal uterus/adnexae: Endometrium appears thickened measuring 16
mm. No fluid in the endometrial canal. The left ovary appears normal
measuring 4.2 x 2.2 x 2.0 cm. The right ovary appears normal
measuring 2.4 x 1.6 x 2.4 cm. Moderate free fluid in the pelvis is
anechoic consistent with simple fluid.
IMPRESSION: No intrauterine pregnancy. Moderate simple free fluid in the pelvis,
without adnexal mass or sonographic finding of ectopic pregnancy.
Findings consistent with pregnancy of unknown location. Recommend
continued trending of beta HCG and follow-up ultrasound as
indicated.

## 2016-09-14 IMAGING — US US OB TRANSVAGINAL
1 series · 15 of 28 positions shown · non-contrast
Comparison: 03/13/2015

CLINICAL DATA: Follow-up pregnancy of unknown location. Vaginal
spotting today.

EXAM:
TRANSVAGINAL OB ULTRASOUND
TECHNIQUE: Transvaginal ultrasound was performed for complete evaluation of the
gestation as well as the maternal uterus, adnexal regions, and
pelvic cul-de-sac.

[Series 1: us ob transvaginal · 15 of 29 slices shown]
[im 1/29]
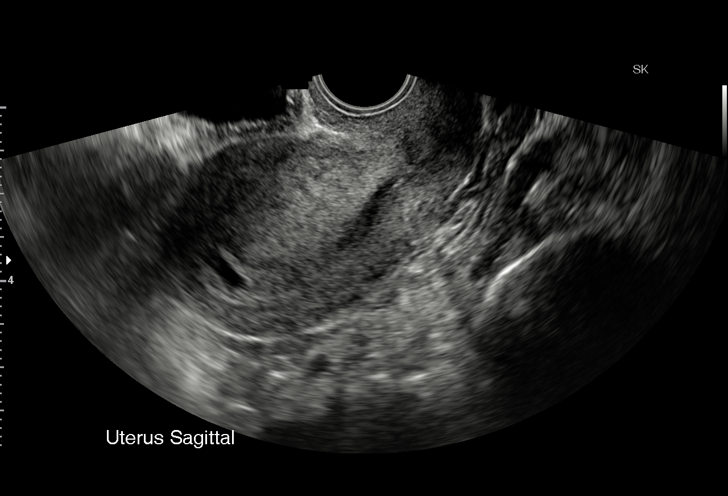
[im 3/29]
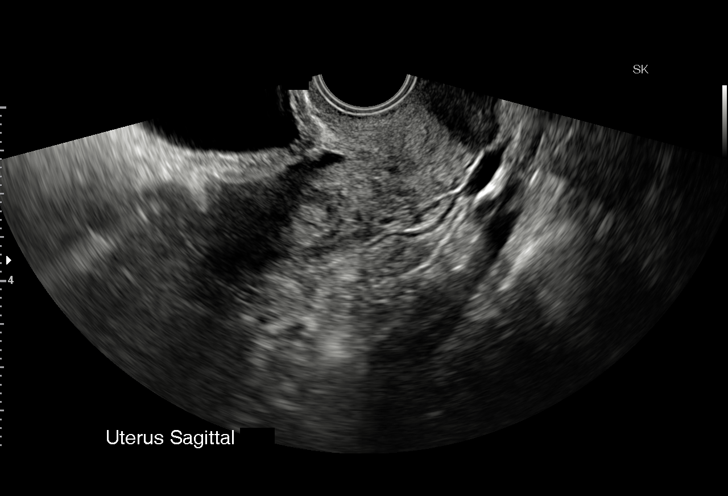
[im 5/29]
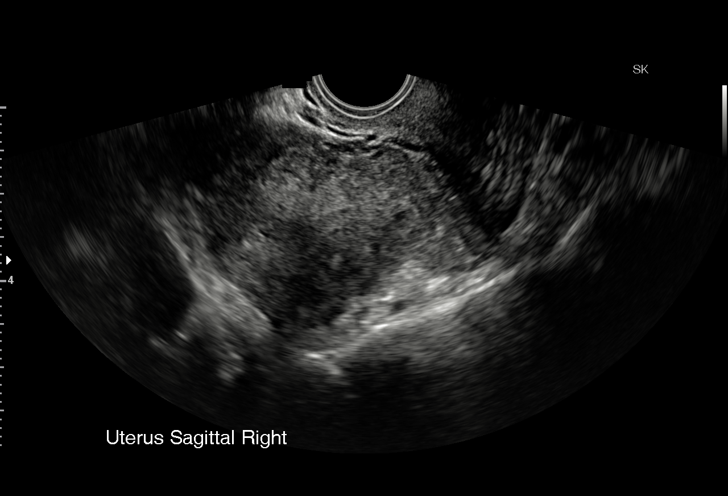
[im 7/29]
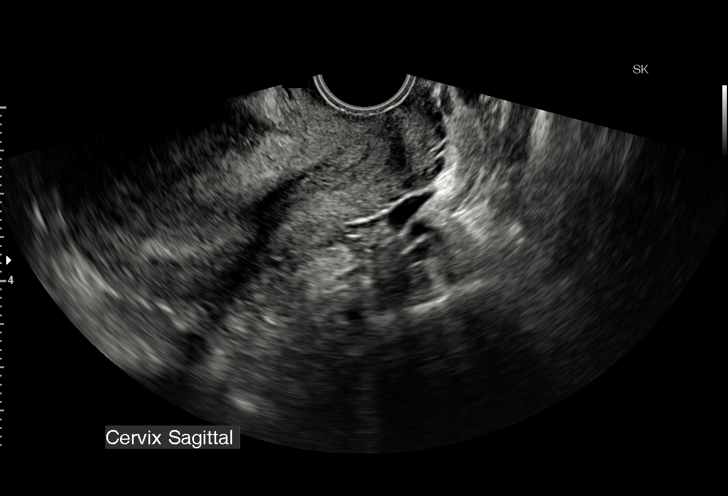
[im 9/29]
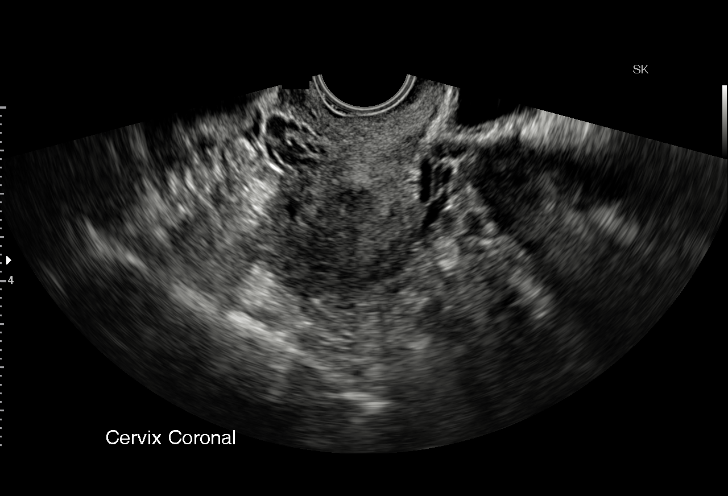
[im 11/29]
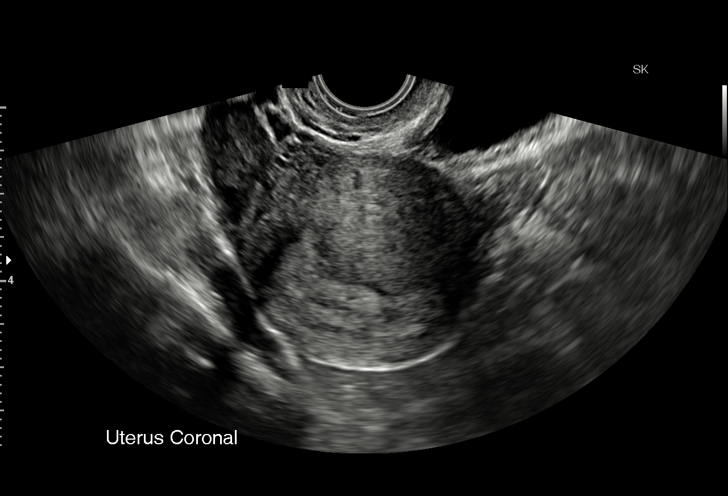
[im 13/29]
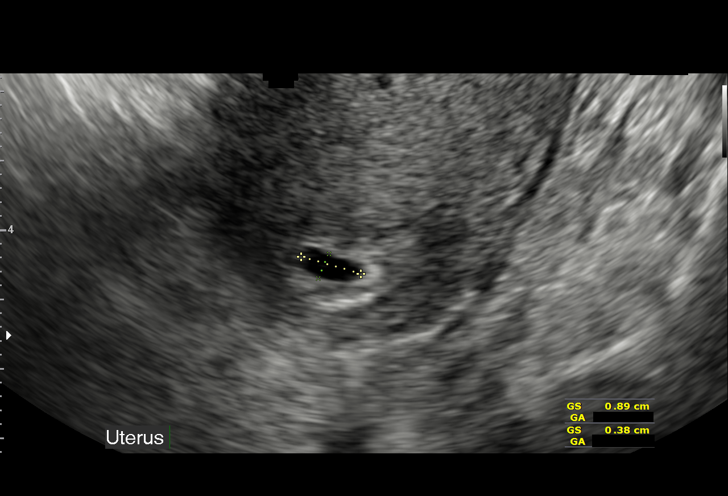
[im 15/29]
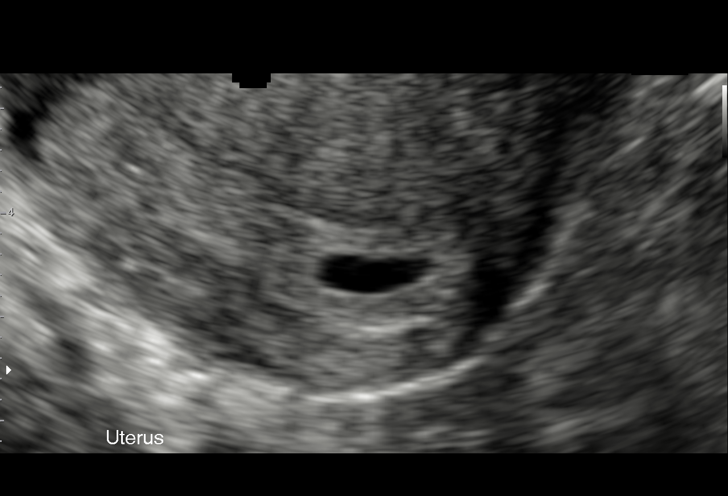
[im 16/29]
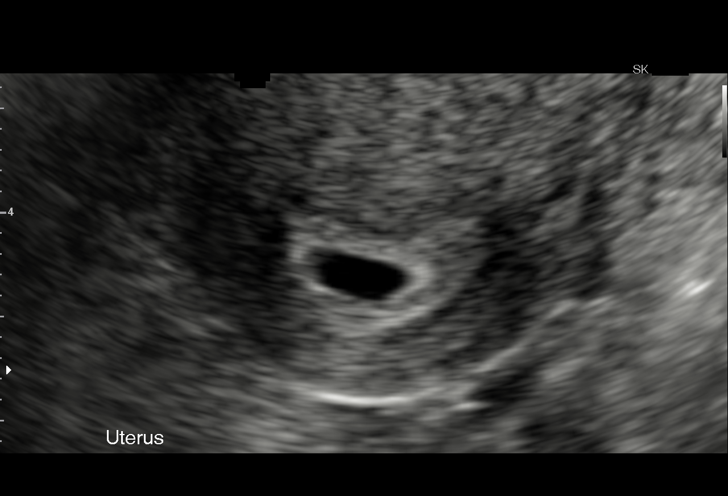
[im 18/29]
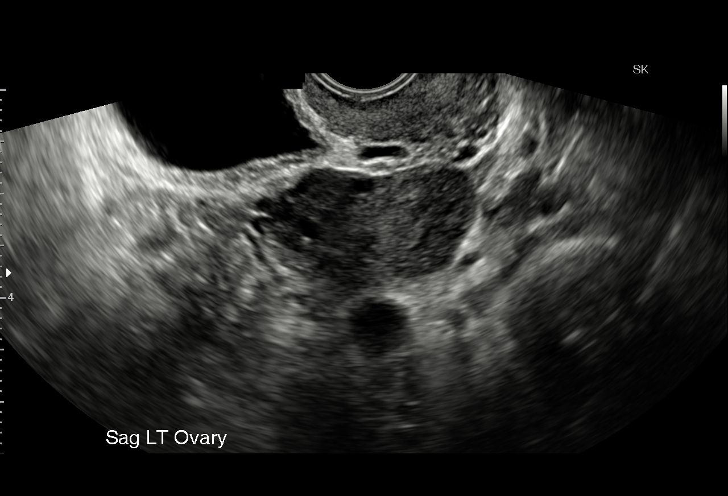
[im 20/29]
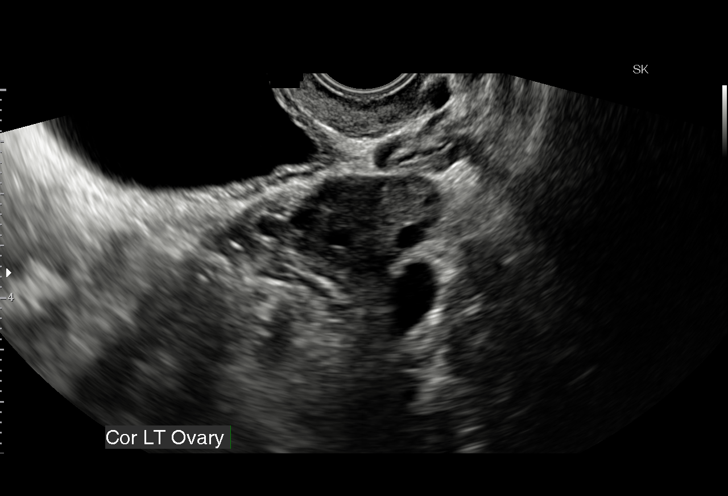
[im 22/29]
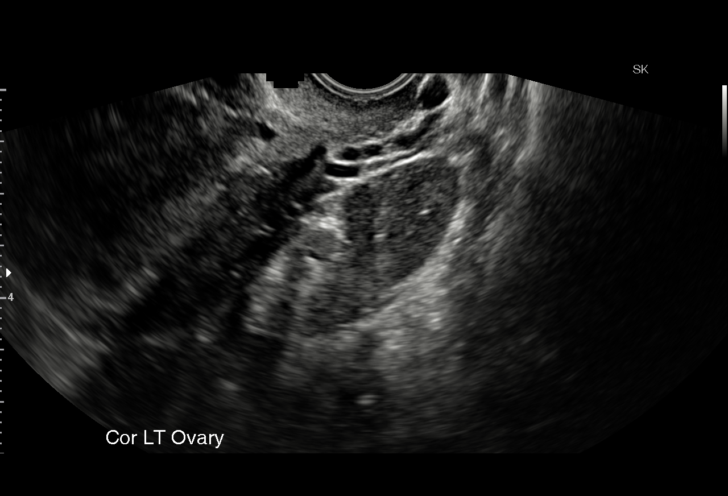
[im 24/29]
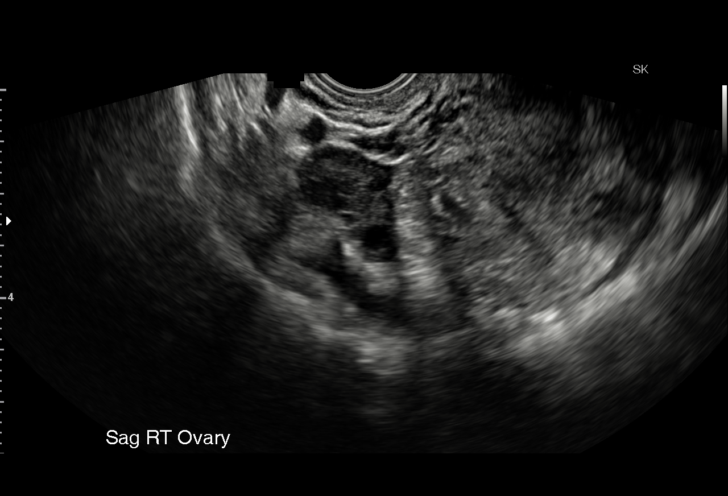
[im 26/29]
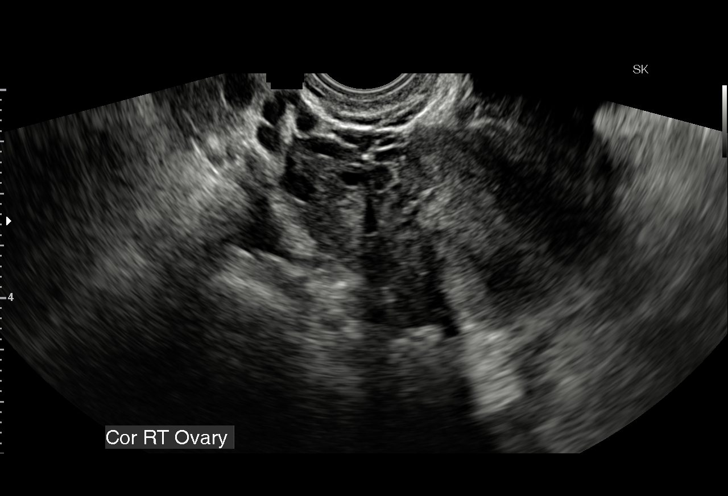
[im 29/29]
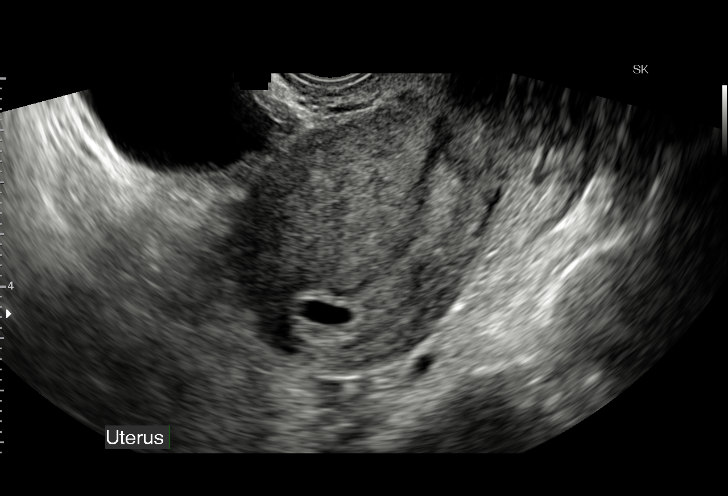

[15 of 28 positions shown; findings below may reference images not displayed]

FINDINGS: Intrauterine gestational sac: Visualized/normal in shape.

Yolk sac:  Not present.

Embryo:  Not present.

MSD: 7.8  mm   5 w   4  d              US EDC: 11/17/2015

Subchorionic hemorrhage:  None visualized.

Maternal uterus/adnexae: Both ovaries are visualized and normal in
size. Diminished free fluid in the pelvis from prior exam.
IMPRESSION: Probable early intrauterine gestational sac, but no yolk sac, fetal
pole, or cardiac activity yet visualized. Recommend follow-up
quantitative B-HCG levels and follow-up US in 14 days to confirm and
assess viability. This recommendation follows [employer] consensus
guidelines: Diagnostic Criteria for Nonviable Pregnancy Early in the
First Trimester. N Engl J Med 7517; [DATE].
